# Patient Record
Sex: Male | Born: 1965
Health system: Southern US, Community
[De-identification: ages and names within clinical notes are randomized; demographics above are authoritative.]

## PROBLEM LIST (undated history)

## (undated) DIAGNOSIS — E785 Hyperlipidemia, unspecified: Secondary | ICD-10-CM

## (undated) DIAGNOSIS — B059 Measles without complication: Secondary | ICD-10-CM

## (undated) DIAGNOSIS — E781 Pure hyperglyceridemia: Secondary | ICD-10-CM

## (undated) DIAGNOSIS — B019 Varicella without complication: Secondary | ICD-10-CM

## (undated) DIAGNOSIS — I10 Essential (primary) hypertension: Secondary | ICD-10-CM

## (undated) DIAGNOSIS — G4730 Sleep apnea, unspecified: Secondary | ICD-10-CM

## (undated) DIAGNOSIS — B269 Mumps without complication: Secondary | ICD-10-CM

## (undated) DIAGNOSIS — M10069 Idiopathic gout, unspecified knee: Secondary | ICD-10-CM

## (undated) HISTORY — DX: Essential (primary) hypertension: I10

## (undated) HISTORY — PX: TESTICLE REMOVAL: SHX68

## (undated) HISTORY — DX: Sleep apnea, unspecified: G47.30

## (undated) HISTORY — DX: Idiopathic gout, unspecified knee: M10.069

## (undated) HISTORY — DX: Measles without complication: B05.9

## (undated) HISTORY — DX: Mumps without complication: B26.9

## (undated) HISTORY — DX: Hyperlipidemia, unspecified: E78.5

## (undated) HISTORY — DX: Varicella without complication: B01.9

## (undated) HISTORY — DX: Pure hyperglyceridemia: E78.1

---

## 2009-03-03 ENCOUNTER — Ambulatory Visit: Payer: Self-pay | Admitting: Interventional Radiology

## 2009-03-03 ENCOUNTER — Emergency Department (HOSPITAL_BASED_OUTPATIENT_CLINIC_OR_DEPARTMENT_OTHER): Admission: EM | Admit: 2009-03-03 | Discharge: 2009-03-03 | Payer: Self-pay | Admitting: Emergency Medicine

## 2009-03-11 ENCOUNTER — Emergency Department (HOSPITAL_BASED_OUTPATIENT_CLINIC_OR_DEPARTMENT_OTHER): Admission: EM | Admit: 2009-03-11 | Discharge: 2009-03-11 | Payer: Self-pay | Admitting: Emergency Medicine

## 2013-05-11 ENCOUNTER — Ambulatory Visit (INDEPENDENT_AMBULATORY_CARE_PROVIDER_SITE_OTHER): Payer: BC Managed Care – PPO | Admitting: Physician Assistant

## 2013-05-11 ENCOUNTER — Encounter: Payer: Self-pay | Admitting: Physician Assistant

## 2013-05-11 VITALS — BP 148/102 | HR 74 | Temp 97.6°F | Resp 20 | Ht 74.0 in | Wt 282.5 lb

## 2013-05-11 DIAGNOSIS — L0390 Cellulitis, unspecified: Secondary | ICD-10-CM

## 2013-05-11 DIAGNOSIS — Z299 Encounter for prophylactic measures, unspecified: Secondary | ICD-10-CM

## 2013-05-11 DIAGNOSIS — Z0000 Encounter for general adult medical examination without abnormal findings: Secondary | ICD-10-CM

## 2013-05-11 LAB — BASIC METABOLIC PANEL
Calcium: 9 mg/dL (ref 8.4–10.5)
Glucose, Bld: 78 mg/dL (ref 70–99)
Potassium: 4.3 mEq/L (ref 3.5–5.3)
Sodium: 138 mEq/L (ref 135–145)

## 2013-05-11 LAB — HEPATIC FUNCTION PANEL
AST: 39 U/L — ABNORMAL HIGH (ref 0–37)
Albumin: 4.1 g/dL (ref 3.5–5.2)
Alkaline Phosphatase: 61 U/L (ref 39–117)
Bilirubin, Direct: 0.2 mg/dL (ref 0.0–0.3)
Total Bilirubin: 0.9 mg/dL (ref 0.3–1.2)

## 2013-05-11 LAB — CBC WITH DIFFERENTIAL/PLATELET
Basophils Absolute: 0 10*3/uL (ref 0.0–0.1)
Basophils Relative: 0 % (ref 0–1)
Eosinophils Absolute: 0.1 10*3/uL (ref 0.0–0.7)
Eosinophils Relative: 2 % (ref 0–5)
HCT: 42.4 % (ref 39.0–52.0)
MCHC: 36.1 g/dL — ABNORMAL HIGH (ref 30.0–36.0)
MCV: 83.1 fL (ref 78.0–100.0)
Monocytes Absolute: 0.5 10*3/uL (ref 0.1–1.0)
Platelets: 158 10*3/uL (ref 150–400)
RDW: 13.5 % (ref 11.5–15.5)
WBC: 6.2 10*3/uL (ref 4.0–10.5)

## 2013-05-11 LAB — LIPID PANEL
Cholesterol: 171 mg/dL (ref 0–200)
HDL: 42 mg/dL (ref >39)
Total CHOL/HDL Ratio: 4.1 Ratio
VLDL: 38 mg/dL (ref 0–40)

## 2013-05-11 MED ORDER — DOXYCYCLINE HYCLATE 100 MG PO TABS: 100.0000 mg | ORAL_TABLET | Freq: Two times a day (BID) | ORAL | Status: DC

## 2013-05-11 NOTE — Assessment & Plan Note (Signed)
Rx Doxycycline 100mg  BID x 10 days.  Ibuprofen as needed for pain relief.  Cold compresses to area for swelling. Patient educated on alarm symptoms and when to go to ED.  Patient to return in 1 week for reassessment.

## 2013-05-11 NOTE — Patient Instructions (Addendum)
Please take antibiotic as prescribed.  Try cold compresses for swelling and ibuprofen for pain relief.  Please obtain labs.  DASH Diet The DASH diet stands for "Dietary Approaches to Stop Hypertension." It is a healthy eating plan that has been shown to reduce high blood pressure (hypertension) in as little as 14 days, while also possibly providing other significant health benefits. These other health benefits include reducing the risk of breast cancer after menopause and reducing the risk of type 2 diabetes, heart disease, colon cancer, and stroke. Health benefits also include weight loss and slowing kidney failure in patients with chronic kidney disease.  DIET GUIDELINES  Limit salt (sodium). Your diet should contain less than 1500 mg of sodium daily.  Limit refined or processed carbohydrates. Your diet should include mostly whole grains. Desserts and added sugars should be used sparingly.  Include small amounts of heart-healthy fats. These types of fats include nuts, oils, and tub margarine. Limit saturated and trans fats. These fats have been shown to be harmful in the body. CHOOSING FOODS  The following food groups are based on a 2000 calorie diet. See your Registered Dietitian for individual calorie needs. Grains and Grain Products (6 to 8 servings daily)  Eat More Often: Whole-wheat bread, brown rice, whole-grain or wheat pasta, quinoa, popcorn without added fat or salt (air popped).  Eat Less Often: White bread, white pasta, white rice, cornbread. Vegetables (4 to 5 servings daily)  Eat More Often: Fresh, frozen, and canned vegetables. Vegetables may be raw, steamed, roasted, or grilled with a minimal amount of fat.  Eat Less Often/Avoid: Creamed or fried vegetables. Vegetables in a cheese sauce. Fruit (4 to 5 servings daily)  Eat More Often: All fresh, canned (in natural juice), or frozen fruits. Dried fruits without added sugar. One hundred percent fruit juice ( cup [237 mL]  daily).  Eat Less Often: Dried fruits with added sugar. Canned fruit in light or heavy syrup. [employer], Fish, and Poultry (2 servings or less daily. One serving is 3 to 4 oz [85-114 g]).  Eat More Often: Ninety percent or leaner ground beef, tenderloin, sirloin. Round cuts of beef, chicken breast, [place] breast. All fish. Grill, bake, or broil your meat. Nothing should be fried.  Eat Less Often/Avoid: Fatty cuts of meat, [place], or chicken leg, thigh, or wing. Fried cuts of meat or fish. Dairy (2 to 3 servings)  Eat More Often: Low-fat or fat-free milk, low-fat plain or light yogurt, reduced-fat or part-skim cheese.  Eat Less Often/Avoid: Milk (whole, 2%).Whole milk yogurt. Full-fat cheeses. Nuts, Seeds, and Legumes (4 to 5 servings per week)  Eat More Often: All without added salt.  Eat Less Often/Avoid: Salted nuts and seeds, canned beans with added salt. Fats and Sweets (limited)  Eat More Often: Vegetable oils, tub margarines without trans fats, sugar-free gelatin. Mayonnaise and salad dressings.  Eat Less Often/Avoid: Coconut oils, palm oils, butter, stick margarine, cream, half and half, cookies, candy, pie. FOR MORE INFORMATION The Dash Diet Eating Plan: www.dashdiet.org Document Released: 09/09/2011 Document Revised: 12/13/2011 Document Reviewed: 09/09/2011 [facility] Patient Information 2014 [place], [place].

## 2013-05-11 NOTE — Progress Notes (Signed)
Patient ID: Corey Nguyen, male   DOB: [birth year], 47 y.o.   MRN: [id number]   New patient who will be establishing care with [place] next week.  Presents today for acute visit c/o redness, swelling and pain in left foot x 4 days.  Patient has history of gout and was concerned it had returned.  Area of concern is on the top left foot.  Denies redness to big toe or other joints.  Denies decrease in range of motion.  Denies radiation of pain.  Denies insect bite, trauma or puncture wound.  States it does not feel like gout he had before.  Denies fever, chills, sweats, N/V or rash.  Recently traveled to [place] this past week.  Past Medical History  Diagnosis Date  . Elevated blood pressure   . Idiopathic gout of knee    No current outpatient prescriptions on file prior to visit.   No current facility-administered medications on file prior to visit.   No Known Allergies  No family history on file.  History   Social History  . Marital Status: Married    Spouse Name: N/A    Number of Children: N/A  . Years of Education: N/A   Social History Main Topics  . Smoking status: Never Smoker   . Smokeless tobacco: None  . Alcohol Use: None  . Drug Use: None  . Sexually Active: None   Other Topics Concern  . None   Social History Narrative  . None   Review of Systems  Constitutional: Negative for fever, chills, weight loss and malaise/fatigue.  Gastrointestinal: Negative for nausea, vomiting and abdominal pain.  Musculoskeletal: Negative for myalgias.  Skin: Negative for itching and rash.       + swelling and erythema of L foot   Neurological: Negative for dizziness, tingling, sensory change and headaches.   Filed Vitals:   05/11/13 1137  BP: 148/102  Pulse: 74  Temp: 97.6 F (36.4 C)  Resp: 20   Physical Exam  Constitutional: He is oriented to person, place, and time and well-developed, well-nourished, and in no distress.  HENT:  Head: Normocephalic and atraumatic.  Eyes:  Conjunctivae are normal. Pupils are equal, round, and reactive to light.  Neurological: He is alert and oriented to person, place, and time.  Skin: Skin is warm and dry.  Presence of a 6 cm, circular region of erythema noted on anterior left foot, without drainage or evidence of necrosis.   Assessment/Plan: No problem-specific assessment & plan notes found for this encounter.

## 2013-05-11 NOTE — Assessment & Plan Note (Signed)
Obtain fasting labs today for "New Patient" Visit next week.  Will discuss results with patient at his visit.

## 2013-05-12 LAB — URINALYSIS, ROUTINE W REFLEX MICROSCOPIC
Bilirubin Urine: NEGATIVE
Glucose, UA: NEGATIVE mg/dL
Hgb urine dipstick: NEGATIVE
Ketones, ur: NEGATIVE mg/dL
Leukocytes, UA: NEGATIVE
Protein, ur: NEGATIVE mg/dL
pH: 7 (ref 5.0–8.0)

## 2013-05-12 LAB — TSH: TSH: 0.754 u[IU]/mL (ref 0.350–4.500)

## 2013-05-18 ENCOUNTER — Ambulatory Visit (INDEPENDENT_AMBULATORY_CARE_PROVIDER_SITE_OTHER): Payer: BC Managed Care – PPO | Admitting: Physician Assistant

## 2013-05-18 ENCOUNTER — Encounter: Payer: Self-pay | Admitting: Physician Assistant

## 2013-05-18 VITALS — BP 140/98 | HR 83 | Temp 98.2°F | Resp 18 | Ht 74.0 in | Wt 284.5 lb

## 2013-05-18 DIAGNOSIS — L0291 Cutaneous abscess, unspecified: Secondary | ICD-10-CM

## 2013-05-18 DIAGNOSIS — I10 Essential (primary) hypertension: Secondary | ICD-10-CM

## 2013-05-18 DIAGNOSIS — Z299 Encounter for prophylactic measures, unspecified: Secondary | ICD-10-CM

## 2013-05-18 DIAGNOSIS — J309 Allergic rhinitis, unspecified: Secondary | ICD-10-CM

## 2013-05-18 DIAGNOSIS — L739 Follicular disorder, unspecified: Secondary | ICD-10-CM | POA: Insufficient documentation

## 2013-05-18 DIAGNOSIS — L738 Other specified follicular disorders: Secondary | ICD-10-CM

## 2013-05-18 DIAGNOSIS — L0390 Cellulitis, unspecified: Secondary | ICD-10-CM

## 2013-05-18 MED ORDER — FLUTICASONE PROPIONATE 50 MCG/ACT NA SUSP: 2.0000 | Freq: Every day | NASAL | Status: DC

## 2013-05-18 MED ORDER — ERYTHROMYCIN 2 % EX GEL: Freq: Every day | CUTANEOUS | Status: DC

## 2013-05-18 NOTE — Assessment & Plan Note (Signed)
Attempt trial of lifestyle modifications.  DASH diet.  Caloric Restriction.  Aerobic exercise.  Patient given information.  Follow-up in 3 months.

## 2013-05-18 NOTE — Assessment & Plan Note (Signed)
Resolving.  Finish course of doxycycline.

## 2013-05-18 NOTE — Assessment & Plan Note (Signed)
Reviewed labs with patient.  Patient instructed on importance of annual eye examinations and dental visits.

## 2013-05-18 NOTE — Assessment & Plan Note (Signed)
Rx topical Erythromycin ointment.

## 2013-05-18 NOTE — Patient Instructions (Addendum)
Please finish prescription for doxycycline.  Attempt diet and weight loss (see information below) for blood pressure, snoring and fatigue.  Please schedule appointment for 3 months to re-assess blood pressure and recheck liver enzymes.  Try a daily claritin and Flonase for congestion/allergy relief.  DASH Diet The DASH diet stands for "Dietary Approaches to Stop Hypertension." It is a healthy eating plan that has been shown to reduce high blood pressure (hypertension) in as little as 14 days, while also possibly providing other significant health benefits. These other health benefits include reducing the risk of breast cancer after menopause and reducing the risk of type 2 diabetes, heart disease, colon cancer, and stroke. Health benefits also include weight loss and slowing kidney failure in patients with chronic kidney disease.  DIET GUIDELINES  Limit salt (sodium). Your diet should contain less than 1500 mg of sodium daily.  Limit refined or processed carbohydrates. Your diet should include mostly whole grains. Desserts and added sugars should be used sparingly.  Include small amounts of heart-healthy fats. These types of fats include nuts, oils, and tub margarine. Limit saturated and trans fats. These fats have been shown to be harmful in the body. CHOOSING FOODS  The following food groups are based on a 2000 calorie diet. See your Registered Dietitian for individual calorie needs. Grains and Grain Products (6 to 8 servings daily)  Eat More Often: Whole-wheat bread, brown rice, whole-grain or wheat pasta, quinoa, popcorn without added fat or salt (air popped).  Eat Less Often: White bread, white pasta, white rice, cornbread. Vegetables (4 to 5 servings daily)  Eat More Often: Fresh, frozen, and canned vegetables. Vegetables may be raw, steamed, roasted, or grilled with a minimal amount of fat.  Eat Less Often/Avoid: Creamed or fried vegetables. Vegetables in a cheese sauce. Fruit (4 to 5  servings daily)  Eat More Often: All fresh, canned (in natural juice), or frozen fruits. Dried fruits without added sugar. One hundred percent fruit juice ( cup [237 mL] daily).  Eat Less Often: Dried fruits with added sugar. Canned fruit in light or heavy syrup. [employer], Fish, and Poultry (2 servings or less daily. One serving is 3 to 4 oz [85-114 g]).  Eat More Often: Ninety percent or leaner ground beef, tenderloin, sirloin. Round cuts of beef, chicken breast, [place] breast. All fish. Grill, bake, or broil your meat. Nothing should be fried.  Eat Less Often/Avoid: Fatty cuts of meat, [place], or chicken leg, thigh, or wing. Fried cuts of meat or fish. Dairy (2 to 3 servings)  Eat More Often: Low-fat or fat-free milk, low-fat plain or light yogurt, reduced-fat or part-skim cheese.  Eat Less Often/Avoid: Milk (whole, 2%).Whole milk yogurt. Full-fat cheeses. Nuts, Seeds, and Legumes (4 to 5 servings per week)  Eat More Often: All without added salt.  Eat Less Often/Avoid: Salted nuts and seeds, canned beans with added salt. Fats and Sweets (limited)  Eat More Often: Vegetable oils, tub margarines without trans fats, sugar-free gelatin. Mayonnaise and salad dressings.  Eat Less Often/Avoid: Coconut oils, palm oils, butter, stick margarine, cream, half and half, cookies, candy, pie. FOR MORE INFORMATION The Dash Diet Eating Plan: www.dashdiet.org Document Released: 09/09/2011 Document Revised: 12/13/2011 Document Reviewed: 09/09/2011 [facility] Patient Information 2014 [place], [place].  Calorie Counting Diet A calorie counting diet requires you to eat the number of calories that are right for you in a day. Calories are the measurement of how much energy you get from the food you eat. Eating the right  amount of calories is important for staying at a healthy weight. If you eat too many calories, your body will store them as fat and you may gain weight. If you eat too few calories, you  may lose weight. Counting the number of calories you eat during a day will help you know if you are eating the right amount. A Registered Dietitian can determine how many calories you need in a day. The amount of calories needed varies from person to person. If your goal is to lose weight, you will need to eat fewer calories. Losing weight can benefit you if you are overweight or have health problems such as heart disease, high blood pressure, or diabetes. If your goal is to gain weight, you will need to eat more calories. Gaining weight may be necessary if you have a certain health problem that causes your body to need more energy. TIPS Whether you are increasing or decreasing the number of calories you eat during a day, it may be hard to get used to changes in what you eat and drink. The following are tips to help you keep track of the number of calories you eat.  Measure foods at home with measuring cups. This helps you know the amount of food and number of calories you are eating.  Restaurants often serve food in amounts that are larger than 1 serving. While eating out, estimate how many servings of a food you are given. For example, a serving of cooked rice is  cup or about the size of half of a fist. Knowing serving sizes will help you be aware of how much food you are eating at restaurants.  Ask for smaller portion sizes or child-size portions at restaurants.  Plan to eat half of a meal at a restaurant. Take the rest home or share the other half with a friend.  Read the Nutrition Facts panel on food labels for calorie content and serving size. You can find out how many servings are in a package, the size of a serving, and the number of calories each serving has.  For example, a package might contain 3 cookies. The Nutrition Facts panel on that package says that 1 serving is 1 cookie. Below that, it will say there are 3 servings in the container. The calories section of the Nutrition Facts  label says there are 90 calories. This means there are 90 calories in 1 cookie (1 serving). If you eat 1 cookie you have eaten 90 calories. If you eat all 3 cookies, you have eaten 270 calories (3 servings x 90 calories = 270 calories). The list below tells you how big or small some common portion sizes are.  1 oz.........4 stacked dice.  3 oz........Marland KitchenDeck of cards.  1 tsp.......Mar[place] KitchenTip of little finger.  1 tbs......Marland KitchenMarland KitchenThumb.  2 tbs.......Marland KitchenGolf ball.   cup......Marland KitchenHalf of a fist.  1 cup.......Marland KitchenA fist. KEEP A FOOD LOG Write down every food item you eat, the amount you eat, and the number of calories in each food you eat during the day. At the end of the day, you can add up the total number of calories you have eaten. It may help to keep a list like the one below. Find out the calorie information by reading the Nutrition Facts panel on food labels. Breakfast  Bran cereal (1 cup, 110 calories).  Fat-free milk ( cup, 45 calories). Snack  Apple (1 medium, 80 calories). Lunch  Spinach (1 cup, 20 calories).  Tomato ( medium, 20  calories).  Chicken breast strips (3 oz, 165 calories).  Shredded cheddar cheese ( cup, 110 calories).  Light [place] dressing (2 tbs, 60 calories).  Whole-wheat bread (1 slice, 80 calories).  Tub margarine (1 tsp, 35 calories).  Vegetable soup (1 cup, 160 calories). Dinner  Pork chop (3 oz, 190 calories).  Brown rice (1 cup, 215 calories).  Steamed broccoli ( cup, 20 calories).  Strawberries (1  cup, 65 calories).  Whipped cream (1 tbs, 50 calories). Daily Calorie Total: 1425 Document Released: 09/20/2005 Document Revised: 12/13/2011 Document Reviewed: 03/17/2007 [facility] Patient Information 2014 [place], [place].

## 2013-05-18 NOTE — Progress Notes (Signed)
Patient ID: Corey Nguyen, male   DOB: July 21, 1966, 47 y.o.   MRN: [id number]   Patient is a 47 year-old caucasian male wh presents to establish care.   Health Maintenance: (1) Annual Eye Exam -- last visit 6 years ago. (2) Dental Care -- last visit 6 years ago. (3) Immunizations -- Patient reports being up to date.  Acute Concerns: Elevated BP -- patient states he has a history of elevated blood pressure but no formal diagnosis.  BP has been elevated at today's visit and visit last week.  Denies headache, lightheadedness, dizziness.  Denies history of high cholesterol.  Fatigue -- patient states he has noticed some increase in fatigue over the past few months.  Denies difficulty sleeping but states he snores, wakes up feeling like he stopped breathing, and never feels completely rested.  His wife has mentioned his snoring and "apneic" episodes.  Denies former diagnosis of sleep apnea.  Has never had a sleep study. Endorses weight gain of 20 or so pounds over the past year  Cellulitis of L foot -- Resolving.  Patient states no pain or discomfort.  2 more days of doxycycline antibiotic.  Patient endorses chronic folliculitis of lower torso.  Does not shave.  States occasionally he wil develop "boils" to the area if the folliculitis gets too agitated by his clothing.  Past Medical History  Diagnosis Date  . Elevated blood pressure   . Idiopathic gout of knee   . Chicken pox     childhood  . Measles     childhood  . Mumps     childhood   Current Outpatient Prescriptions on File Prior to Visit  Medication Sig Dispense Refill  . doxycycline (VIBRA-TABS) 100 MG tablet Take 1 tablet (100 mg total) by mouth 2 (two) times daily.  20 tablet  0   No current facility-administered medications on file prior to visit.   No Known Allergies Family History  Problem Relation Age of Onset  . Hypercholesterolemia Father     Living  . Hypercholesterolemia Brother   . Healthy Mother     Living   . Diabetes Maternal Grandfather     Insulin  . Heart defect Maternal Grandmother   . Prostate cancer Paternal Grandfather   . Healthy Paternal Grandmother     Living  . Healthy Paternal Uncle   . Diabetes Maternal Aunt   . Healthy Maternal Aunt   . Alzheimer's disease Maternal Grandmother   . Migraines Daughter   . Healthy Other     Siblings   History   Social History  . Marital Status: Married    Spouse Name: N/A    Number of Children: N/A  . Years of Education: N/A   Social History Main Topics  . Smoking status: Never Smoker   . Smokeless tobacco: Former Neurosurgeon  . Alcohol Use: 14.4 oz/week    24 Cans of beer per week  . Drug Use: No  . Sexual Activity: None   Other Topics Concern  . None   Social History Narrative  . None   Review of Systems  Constitutional: Positive for malaise/fatigue. Negative for fever, chills and weight loss.  HENT: Negative for hearing loss, ear pain, neck pain, tinnitus and ear discharge.   Eyes: Negative for blurred vision, double vision and photophobia.  Respiratory: Negative for cough, hemoptysis, sputum production, shortness of breath and wheezing.   Cardiovascular: Negative for chest pain, palpitations and leg swelling.  Gastrointestinal: Negative for nausea, vomiting, abdominal pain,  diarrhea, constipation, blood in stool and melena.  Genitourinary: Negative for dysuria, urgency, frequency, hematuria and flank pain.  Musculoskeletal: Negative for myalgias, back pain and joint pain.  Skin: Positive for rash.  Neurological: Negative for dizziness, tingling, seizures, loss of consciousness and headaches.  Endo/Heme/Allergies: Positive for environmental allergies. Does not bruise/bleed easily.  Psychiatric/Behavioral: Negative for depression, suicidal ideas and substance abuse. The patient does not have insomnia.    Filed Vitals:   05/18/13 1344  BP: 140/98  Pulse: 83  Temp: 98.2 F (36.8 C)  Resp: 18   Physical Exam  Vitals  reviewed. Constitutional: He is oriented to person, place, and time and well-developed, well-nourished, and in no distress.  HENT:  Head: Normocephalic and atraumatic.  Right Ear: External ear normal.  Left Ear: External ear normal.  Nose: Nose normal.  Mouth/Throat: Oropharynx is clear and moist. No oropharyngeal exudate.  TM WNL bilaterally  Eyes: Conjunctivae and EOM are normal. Pupils are equal, round, and reactive to light.  Neck: Normal range of motion. Neck supple. No thyromegaly present.  Cardiovascular: Normal rate, regular rhythm, normal heart sounds and intact distal pulses.   Pulmonary/Chest: Effort normal and breath sounds normal. No respiratory distress. He has no wheezes. He has no rales. He exhibits no tenderness.  Abdominal: Soft. Bowel sounds are normal. He exhibits no distension and no mass. There is no tenderness. There is no rebound and no guarding.  Genitourinary:  Deferred GU examination  Musculoskeletal: Normal range of motion.  Lymphadenopathy:    He has no cervical adenopathy.  Neurological: He is alert and oriented to person, place, and time. He has normal reflexes. No cranial nerve deficit.  Skin: Skin is warm and dry. No rash noted.    Assessment/Plan: Cellulitis Resolving.  Finish course of doxycycline.  Essential hypertension, benign Attempt trial of lifestyle modifications.  DASH diet.  Caloric Restriction.  Aerobic exercise.  Patient given information.  Follow-up in 3 months.  Preventive measure Reviewed labs with patient.  Patient instructed on importance of annual eye examinations and dental visits.  Folliculitis Rx topical Erythromycin ointment.

## 2013-08-09 ENCOUNTER — Other Ambulatory Visit: Payer: Self-pay

## 2013-08-24 ENCOUNTER — Ambulatory Visit (INDEPENDENT_AMBULATORY_CARE_PROVIDER_SITE_OTHER): Payer: BC Managed Care – PPO | Admitting: Physician Assistant

## 2013-08-24 ENCOUNTER — Encounter: Payer: Self-pay | Admitting: Physician Assistant

## 2013-08-24 VITALS — BP 148/104 | HR 76 | Temp 98.0°F | Resp 16 | Ht 74.0 in | Wt 282.2 lb

## 2013-08-24 DIAGNOSIS — R899 Unspecified abnormal finding in specimens from other organs, systems and tissues: Secondary | ICD-10-CM

## 2013-08-24 DIAGNOSIS — I10 Essential (primary) hypertension: Secondary | ICD-10-CM

## 2013-08-24 DIAGNOSIS — R6889 Other general symptoms and signs: Secondary | ICD-10-CM

## 2013-08-24 LAB — LIPID PANEL
Cholesterol: 192 mg/dL (ref 0–200)
Total CHOL/HDL Ratio: 4.2 Ratio
Triglycerides: 195 mg/dL — ABNORMAL HIGH (ref <150)

## 2013-08-24 LAB — COMPREHENSIVE METABOLIC PANEL
ALT: 58 U/L — ABNORMAL HIGH (ref 0–53)
BUN: 14 mg/dL (ref 6–23)
CO2: 24 mEq/L (ref 19–32)
Calcium: 9 mg/dL (ref 8.4–10.5)
Chloride: 103 mEq/L (ref 96–112)
Creat: 0.88 mg/dL (ref 0.50–1.35)
Glucose, Bld: 86 mg/dL (ref 70–99)

## 2013-08-24 MED ORDER — LISINOPRIL-HYDROCHLOROTHIAZIDE 10-12.5 MG PO TABS: 1.0000 | ORAL_TABLET | Freq: Every day | ORAL | Status: DC

## 2013-08-24 NOTE — Progress Notes (Signed)
Pre visit review using our clinic review tool, if applicable. No additional management support is needed unless otherwise documented below in the visit note/SLS  

## 2013-08-24 NOTE — Patient Instructions (Signed)
Please take medication as prescribed.  Monitor BP at home -- goal is 120-130/70-80.  I want to see you in 2 weeks.  Please return sooner if you develop a BP lower than 100/60 or if you develop a cough, palpitations, lightheadedness.    DASH Diet The DASH diet stands for "Dietary Approaches to Stop Hypertension." It is a healthy eating plan that has been shown to reduce high blood pressure (hypertension) in as little as 14 days, while also possibly providing other significant health benefits. These other health benefits include reducing the risk of breast cancer after menopause and reducing the risk of type 2 diabetes, heart disease, colon cancer, and stroke. Health benefits also include weight loss and slowing kidney failure in patients with chronic kidney disease.  DIET GUIDELINES  Limit salt (sodium). Your diet should contain less than 1500 mg of sodium daily.  Limit refined or processed carbohydrates. Your diet should include mostly whole grains. Desserts and added sugars should be used sparingly.  Include small amounts of heart-healthy fats. These types of fats include nuts, oils, and tub margarine. Limit saturated and trans fats. These fats have been shown to be harmful in the body. CHOOSING FOODS  The following food groups are based on a 2000 calorie diet. See your Registered Dietitian for individual calorie needs. Grains and Grain Products (6 to 8 servings daily)  Eat More Often: Whole-wheat bread, brown rice, whole-grain or wheat pasta, quinoa, popcorn without added fat or salt (air popped).  Eat Less Often: White bread, white pasta, white rice, cornbread. Vegetables (4 to 5 servings daily)  Eat More Often: Fresh, frozen, and canned vegetables. Vegetables may be raw, steamed, roasted, or grilled with a minimal amount of fat.  Eat Less Often/Avoid: Creamed or fried vegetables. Vegetables in a cheese sauce. Fruit (4 to 5 servings daily)  Eat More Often: All fresh, canned (in natural  juice), or frozen fruits. Dried fruits without added sugar. One hundred percent fruit juice ( cup [237 mL] daily).  Eat Less Often: Dried fruits with added sugar. Canned fruit in light or heavy syrup. [employer], Fish, and Poultry (2 servings or less daily. One serving is 3 to 4 oz [85-114 g]).  Eat More Often: Ninety percent or leaner ground beef, tenderloin, sirloin. Round cuts of beef, chicken breast, [place] breast. All fish. Grill, bake, or broil your meat. Nothing should be fried.  Eat Less Often/Avoid: Fatty cuts of meat, [place], or chicken leg, thigh, or wing. Fried cuts of meat or fish. Dairy (2 to 3 servings)  Eat More Often: Low-fat or fat-free milk, low-fat plain or light yogurt, reduced-fat or part-skim cheese.  Eat Less Often/Avoid: Milk (whole, 2%).Whole milk yogurt. Full-fat cheeses. Nuts, Seeds, and Legumes (4 to 5 servings per week)  Eat More Often: All without added salt.  Eat Less Often/Avoid: Salted nuts and seeds, canned beans with added salt. Fats and Sweets (limited)  Eat More Often: Vegetable oils, tub margarines without trans fats, sugar-free gelatin. Mayonnaise and salad dressings.  Eat Less Often/Avoid: Coconut oils, palm oils, butter, stick margarine, cream, half and half, cookies, candy, pie. FOR MORE INFORMATION The Dash Diet Eating Plan: www.dashdiet.org Document Released: 09/09/2011 Document Revised: 12/13/2011 Document Reviewed: 09/09/2011 [facility] Patient Information 2014 [place], [place].

## 2013-08-26 NOTE — Progress Notes (Signed)
Patient ID: Corey Nguyen, male   DOB: Dec 08, 1965, 47 y.o.   MRN: [id number]  Patient presents to clinic today for follow-up of hypertension.  Patient has attempted 79-month trial of diet and exercise.  Patient states he has made some lifestyle changes but does not feel he has made any progress.  BP at 148/104 in clinic at time of visit.  Patient still denies chest pain, palpitations, light headedness, N/V, shortness of breath.  Denies significant stressors that could be responsible for elevated bp.  Denies history of anxiety or depression.  Patient states he is doing well.   Past Medical History  Diagnosis Date  . Elevated blood pressure   . Idiopathic gout of knee   . Chicken pox     childhood  . Measles     childhood  . Mumps     childhood    Current Outpatient Prescriptions on File Prior to Visit  Medication Sig Dispense Refill  . fluticasone (FLONASE) 50 MCG/ACT nasal spray Place 2 sprays into the nose daily.  16 g  6  . oxymetazoline (AFRIN) 0.05 % nasal spray Place 2 sprays into the nose at bedtime as needed for congestion.       No current facility-administered medications on file prior to visit.    No Known Allergies  Family History  Problem Relation Age of Onset  . Hypercholesterolemia Father     Living  . Hypercholesterolemia Brother   . Healthy Mother     Living  . Diabetes Maternal Grandfather     Insulin  . Heart defect Maternal Grandmother   . Prostate cancer Paternal Grandfather   . Healthy Paternal Grandmother     Living  . Healthy Paternal Uncle   . Diabetes Maternal Aunt   . Healthy Maternal Aunt   . Alzheimer's disease Maternal Grandmother   . Migraines Daughter   . Healthy Other     Siblings    History   Social History  . Marital Status: Married    Spouse Name: N/A    Number of Children: N/A  . Years of Education: N/A   Social History Main Topics  . Smoking status: Never Smoker   . Smokeless tobacco: Former Neurosurgeon  . Alcohol Use: 14.4  oz/week    24 Cans of beer per week  . Drug Use: No  . Sexual Activity: None   Other Topics Concern  . None   Social History Narrative  . None    Review of Systems - See HPI.  All other ROS are negative.   Filed Vitals:   08/24/13 0826  BP: 148/104  Pulse: 76  Temp: 98 F (36.7 C)  Resp: 16   Physical Exam  Vitals reviewed. Constitutional: He is oriented to person, place, and time.  Obese, well-developed in no acute distress, sitting on examination table.  HENT:  Head: Normocephalic and atraumatic.  Eyes: Conjunctivae and EOM are normal.  Neck: Neck supple.  Cardiovascular: Normal rate, regular rhythm, normal heart sounds and intact distal pulses.   Pulmonary/Chest: Effort normal and breath sounds normal. No respiratory distress. He has no wheezes. He has no rales. He exhibits no tenderness.  Lymphadenopathy:    He has no cervical adenopathy.  Neurological: He is alert and oriented to person, place, and time. No cranial nerve deficit.  Skin: Skin is warm and dry. No rash noted.  Psychiatric: Affect normal.   Recent Results (from the past 2160 hour(s))  COMPREHENSIVE METABOLIC PANEL  Status: Abnormal   Collection Time    08/24/13  9:27 AM      Result Value Range   Sodium 138  135 - 145 mEq/L   Potassium 4.6  3.5 - 5.3 mEq/L   Chloride 103  96 - 112 mEq/L   CO2 24  19 - 32 mEq/L   Glucose, Bld 86  70 - 99 mg/dL   BUN 14  6 - 23 mg/dL   Creat 4.40  1.02 - 7.25 mg/dL   Total Bilirubin 0.7  0.3 - 1.2 mg/dL   Alkaline Phosphatase 66  39 - 117 U/L   AST 41 (*) 0 - 37 U/L   ALT 58 (*) 0 - 53 U/L   Total Protein 7.4  6.0 - 8.3 g/dL   Albumin 4.6  3.5 - 5.2 g/dL   Calcium 9.0  8.4 - 36.6 mg/dL  LIPID PANEL     Status: Abnormal   Collection Time    08/24/13  9:27 AM      Result Value Range   Cholesterol 192  0 - 200 mg/dL   Comment: ATP III Classification:           < 200        mg/dL        Desirable          200 - 239     mg/dL        Borderline High           >= 240        mg/dL        High         Triglycerides 195 (*) <150 mg/dL   HDL 46  >44 mg/dL   Total CHOL/HDL Ratio 4.2     VLDL 39  0 - 40 mg/dL   LDL Cholesterol 034 (*) 0 - 99 mg/dL   Comment:       Total Cholesterol/HDL Ratio:CHD Risk                            Coronary Heart Disease Risk Table                                            Men       Women              1/2 Average Risk              3.4        3.3                  Average Risk              5.0        4.4               2X Average Risk              9.6        7.1               3X Average Risk             23.4       11.0     Use the calculated Patient Ratio above and the CHD Risk table      to determine the patient's CHD Risk.  ATP III Classification (LDL):           < 100        mg/dL         Optimal          100 - 129     mg/dL         Near or Above Optimal          130 - 159     mg/dL         Borderline High          160 - 189     mg/dL         High           > 190        mg/dL         Very High          Assessment/Plan: Essential hypertension, benign Rx Lisinopril-HCTZ 10-12.5 mg daily.  Continue with diet and exercise.  DASH handout given again.  Monitor BP at home.  Follow-up in 2 weeks.

## 2013-08-26 NOTE — Assessment & Plan Note (Signed)
Rx Lisinopril-HCTZ 10-12.5 mg daily.  Continue with diet and exercise.  DASH handout given again.  Monitor BP at home.  Follow-up in 2 weeks.

## 2013-09-07 ENCOUNTER — Encounter: Payer: Self-pay | Admitting: Physician Assistant

## 2013-09-07 ENCOUNTER — Ambulatory Visit (INDEPENDENT_AMBULATORY_CARE_PROVIDER_SITE_OTHER): Payer: BC Managed Care – PPO | Admitting: Physician Assistant

## 2013-09-07 VITALS — BP 138/94 | HR 86 | Temp 97.5°F | Resp 16 | Ht 74.0 in | Wt 286.0 lb

## 2013-09-07 DIAGNOSIS — I10 Essential (primary) hypertension: Secondary | ICD-10-CM

## 2013-09-07 NOTE — Progress Notes (Signed)
Patient ID: Corey Nguyen, male   DOB: 1965-11-16, 47 y.o.   MRN: [id number]  Patient presents to clinic today for followup of hypertension.  At last visit, patient was placed on lisinopril-hydrochlorothiazide 10-12.5 mg tablet.  Patient endorses taking medication as prescribed. BP clinic today 138/94, improved from last visit.  Patient still denies chest pain, headache, vision changes, palpitations, shortness of breath.  Patient denies cough while on ACE inhibitor.  Patient has been trying to monitor salt intake. Has not been exercising. Has a treadmill, and is trying to start exercising daily.   Past Medical History  Diagnosis Date  . Elevated blood pressure   . Idiopathic gout of knee   . Chicken pox     childhood  . Measles     childhood  . Mumps     childhood    Current Outpatient Prescriptions on File Prior to Visit  Medication Sig Dispense Refill  . fluticasone (FLONASE) 50 MCG/ACT nasal spray Place 2 sprays into the nose daily.  16 g  6  . lisinopril-hydrochlorothiazide (PRINZIDE,ZESTORETIC) 10-12.5 MG per tablet Take 1 tablet by mouth daily.  30 tablet  3  . oxymetazoline (AFRIN) 0.05 % nasal spray Place 2 sprays into the nose at bedtime as needed for congestion.       No current facility-administered medications on file prior to visit.    No Known Allergies  Family History  Problem Relation Age of Onset  . Hypercholesterolemia Father     Living  . Hypercholesterolemia Brother   . Healthy Mother     Living  . Diabetes Maternal Grandfather     Insulin  . Heart defect Maternal Grandmother   . Prostate cancer Paternal Grandfather   . Healthy Paternal Grandmother     Living  . Healthy Paternal Uncle   . Diabetes Maternal Aunt   . Healthy Maternal Aunt   . Alzheimer's disease Maternal Grandmother   . Migraines Daughter   . Healthy Other     Siblings    History   Social History  . Marital Status: Married    Spouse Name: N/A    Number of Children: N/A  .  Years of Education: N/A   Social History Main Topics  . Smoking status: Never Smoker   . Smokeless tobacco: Former Neurosurgeon  . Alcohol Use: 14.4 oz/week    24 Cans of beer per week  . Drug Use: No  . Sexual Activity: None   Other Topics Concern  . None   Social History Narrative  . None    Review of Systems - see history of present illness. All other review of systems are negative.   Filed Vitals:   09/07/13 1609  BP: 138/94  Pulse: 86  Temp: 97.5 F (36.4 C)  Resp: 16   Physical Exam  Vitals reviewed. Constitutional: He is oriented to person, place, and time and well-developed, well-nourished, and in no distress.  HENT:  Head: Normocephalic and atraumatic.  Eyes: Conjunctivae are normal.  Cardiovascular: Normal rate, regular rhythm, normal heart sounds and intact distal pulses.   Pulmonary/Chest: Effort normal and breath sounds normal. No respiratory distress. He has no wheezes. He has no rales. He exhibits no tenderness.  Neurological: He is alert and oriented to person, place, and time. No cranial nerve deficit.  Skin: Skin is warm and dry. No rash noted.  Psychiatric: Affect normal.   Recent Results (from the past 2160 hour(s))  COMPREHENSIVE METABOLIC PANEL     Status: Abnormal  Collection Time    08/24/13  9:27 AM      Result Value Range   Sodium 138  135 - 145 mEq/L   Potassium 4.6  3.5 - 5.3 mEq/L   Chloride 103  96 - 112 mEq/L   CO2 24  19 - 32 mEq/L   Glucose, Bld 86  70 - 99 mg/dL   BUN 14  6 - 23 mg/dL   Creat 1.61  0.96 - 0.45 mg/dL   Total Bilirubin 0.7  0.3 - 1.2 mg/dL   Alkaline Phosphatase 66  39 - 117 U/L   AST 41 (*) 0 - 37 U/L   ALT 58 (*) 0 - 53 U/L   Total Protein 7.4  6.0 - 8.3 g/dL   Albumin 4.6  3.5 - 5.2 g/dL   Calcium 9.0  8.4 - 40.9 mg/dL  LIPID PANEL     Status: Abnormal   Collection Time    08/24/13  9:27 AM      Result Value Range   Cholesterol 192  0 - 200 mg/dL   Comment: ATP III Classification:           < 200         mg/dL        Desirable          200 - 239     mg/dL        Borderline High          >= 240        mg/dL        High         Triglycerides 195 (*) <150 mg/dL   HDL 46  >81 mg/dL   Total CHOL/HDL Ratio 4.2     VLDL 39  0 - 40 mg/dL   LDL Cholesterol 191 (*) 0 - 99 mg/dL   Comment:       Total Cholesterol/HDL Ratio:CHD Risk                            Coronary Heart Disease Risk Table                                            Men       Women              1/2 Average Risk              3.4        3.3                  Average Risk              5.0        4.4               2X Average Risk              9.6        7.1               3X Average Risk             23.4       11.0     Use the calculated Patient Ratio above and the CHD Risk table      to determine the patient's CHD Risk.  ATP III Classification (LDL):           < 100        mg/dL         Optimal          100 - 129     mg/dL         Near or Above Optimal          130 - 159     mg/dL         Borderline High          160 - 189     mg/dL         High           > 190        mg/dL         Very High         Assessment/Plan: Essential hypertension, benign Discussed goal blood pressure with patient.  Patient wishes to attempt to remain on current dose of medication, while using his treadmill at least 3 times a week.  Will continue with sodium restriction in DASH diet.  Followup in one month. Instructed patient up blood pressure remains elevated despite TLC, we'll need to adjust dose of medication.

## 2013-09-07 NOTE — Patient Instructions (Signed)
Continue medication as prescribed.  Increase activity -- use your treadmill !! Follow-up in 1 month for BP recheck.  If BP still elevated, we will need to increase dose of medicine.  Hypertension As your heart beats, it forces blood through your arteries. This force is your blood pressure. If the pressure is too high, it is called hypertension (HTN) or high blood pressure. HTN is dangerous because you may have it and not know it. High blood pressure may mean that your heart has to work harder to pump blood. Your arteries may be narrow or stiff. The extra work puts you at risk for heart disease, stroke, and other problems.  Blood pressure consists of two numbers, a higher number over a lower, 110/72, for example. It is stated as "110 over 72." The ideal is below 120 for the top number (systolic) and under 80 for the bottom (diastolic). Write down your blood pressure today. You should pay close attention to your blood pressure if you have certain conditions such as:  Heart failure.  Prior heart attack.  Diabetes  Chronic kidney disease.  Prior stroke.  Multiple risk factors for heart disease. To see if you have HTN, your blood pressure should be measured while you are seated with your arm held at the level of the heart. It should be measured at least twice. A one-time elevated blood pressure reading (especially in the Emergency Department) does not mean that you need treatment. There may be conditions in which the blood pressure is different between your right and left arms. It is important to see your caregiver soon for a recheck. Most people have essential hypertension which means that there is not a specific cause. This type of high blood pressure may be lowered by changing lifestyle factors such as:  Stress.  Smoking.  Lack of exercise.  Excessive weight.  Drug/tobacco/alcohol use.  Eating less salt. Most people do not have symptoms from high blood pressure until it has caused  damage to the body. Effective treatment can often prevent, delay or reduce that damage. TREATMENT  When a cause has been identified, treatment for high blood pressure is directed at the cause. There are a large number of medications to treat HTN. These fall into several categories, and your caregiver will help you select the medicines that are best for you. Medications may have side effects. You should review side effects with your caregiver. If your blood pressure stays high after you have made lifestyle changes or started on medicines,   Your medication(s) may need to be changed.  Other problems may need to be addressed.  Be certain you understand your prescriptions, and know how and when to take your medicine.  Be sure to follow up with your caregiver within the time frame advised (usually within two weeks) to have your blood pressure rechecked and to review your medications.  If you are taking more than one medicine to lower your blood pressure, make sure you know how and at what times they should be taken. Taking two medicines at the same time can result in blood pressure that is too low. SEEK IMMEDIATE MEDICAL CARE IF:  You develop a severe headache, blurred or changing vision, or confusion.  You have unusual weakness or numbness, or a faint feeling.  You have severe chest or abdominal pain, vomiting, or breathing problems. MAKE SURE YOU:   Understand these instructions.  Will watch your condition.  Will get help right away if you are not doing well or  get worse. Document Released: 09/20/2005 Document Revised: 12/13/2011 Document Reviewed: 05/10/2008 [facility] Patient Information 2014 [place].

## 2013-09-07 NOTE — Assessment & Plan Note (Signed)
Discussed goal blood pressure with patient.  Patient wishes to attempt to remain on current dose of medication, while using his treadmill at least 3 times a week.  Will continue with sodium restriction in DASH diet.  Followup in one month. Instructed patient up blood pressure remains elevated despite TLC, we'll need to adjust dose of medication.

## 2013-09-07 NOTE — Progress Notes (Signed)
Pre visit review using our clinic review tool, if applicable. No additional management support is needed unless otherwise documented below in the visit note/SLS  

## 2013-10-10 ENCOUNTER — Ambulatory Visit (INDEPENDENT_AMBULATORY_CARE_PROVIDER_SITE_OTHER): Payer: BC Managed Care – PPO | Admitting: Physician Assistant

## 2013-10-10 ENCOUNTER — Encounter: Payer: Self-pay | Admitting: Physician Assistant

## 2013-10-10 VITALS — BP 122/84 | HR 73 | Temp 97.9°F | Resp 16 | Ht 74.0 in | Wt 281.5 lb

## 2013-10-10 DIAGNOSIS — I10 Essential (primary) hypertension: Secondary | ICD-10-CM

## 2013-10-10 NOTE — Assessment & Plan Note (Signed)
BP good in clinic.  Continue current medication regimen.  Encouraged continued diet and exercise.  Follow-up in 6 months.

## 2013-10-10 NOTE — Patient Instructions (Signed)
Please continue taking medications as prescribed  Continue good diet and exercising on the treadmill.  I want to see you again in 6 months.  Return sooner if you need [place].  Managing Your High Blood Pressure Blood pressure is a measurement of how forceful your blood is pressing against the walls of the arteries. Arteries are muscular tubes within the circulatory system. Blood pressure does not stay the same. Blood pressure rises when you are active, excited, or nervous; and it lowers during sleep and relaxation. If the numbers measuring your blood pressure stay above normal most of the time, you are at risk for health problems. High blood pressure (hypertension) is a long-term (chronic) condition in which blood pressure is elevated. A blood pressure reading is recorded as two numbers, such as 120 over 80 (or 120/80). The first, higher number is called the systolic pressure. It is a measure of the pressure in your arteries as the heart beats. The second, lower number is called the diastolic pressure. It is a measure of the pressure in your arteries as the heart relaxes between beats.  Keeping your blood pressure in a normal range is important to your overall health and prevention of health problems, such as heart disease and stroke. When your blood pressure is uncontrolled, your heart has to work harder than normal. High blood pressure is a very common condition in adults because blood pressure tends to rise with age. Men and women are equally likely to have hypertension but at different times in life. Before age 71, men are more likely to have hypertension. After 50 years of age, women are more likely to have it. Hypertension is especially common in African Americans. This condition often has no signs or symptoms. The cause of the condition is usually not known. Your caregiver can help you come up with a plan to keep your blood pressure in a normal, healthy range. BLOOD PRESSURE STAGES Blood pressure is  classified into four stages: normal, prehypertension, stage 1, and stage 2. Your blood pressure reading will be used to determine what type of treatment, if any, is necessary. Appropriate treatment options are tied to these four stages:  Normal  Systolic pressure (mm Hg): below 120.  Diastolic pressure (mm Hg): below 80. Prehypertension  Systolic pressure (mm Hg): 120 to 139.  Diastolic pressure (mm Hg): 80 to 89. Stage1  Systolic pressure (mm Hg): 140 to 159.  Diastolic pressure (mm Hg): 90 to 99. Stage2  Systolic pressure (mm Hg): 160 or above.  Diastolic pressure (mm Hg): 100 or above. RISKS RELATED TO HIGH BLOOD PRESSURE Managing your blood pressure is an important responsibility. Uncontrolled high blood pressure can lead to:  A heart attack.  A stroke.  A weakened blood vessel (aneurysm).  Heart failure.  Kidney damage.  Eye damage.  Metabolic syndrome.  Memory and concentration problems. HOW TO MANAGE YOUR BLOOD PRESSURE Blood pressure can be managed effectively with lifestyle changes and medicines (if needed). Your caregiver will help you come up with a plan to bring your blood pressure within a normal range. Your plan should include the following: Education  Read all information provided by your caregivers about how to control blood pressure.  Educate yourself on the latest guidelines and treatment recommendations. New research is always being done to further define the risks and treatments for high blood pressure. Lifestylechanges  Control your weight.  Avoid smoking.  Stay physically active.  Reduce the amount of salt in your diet.  Reduce stress.  Control any chronic conditions, such as high cholesterol or diabetes.  Reduce your alcohol intake. Medicines  Several medicines (antihypertensive medicines) are available, if needed, to bring blood pressure within a normal range. Communication  Review all the medicines you take with your  caregiver because there may be side effects or interactions.  Talk with your caregiver about your diet, exercise habits, and other lifestyle factors that may be contributing to high blood pressure.  See your caregiver regularly. Your caregiver can help you create and adjust your plan for managing high blood pressure. RECOMMENDATIONS FOR TREATMENT AND FOLLOW-UP  The following recommendations are based on current guidelines for managing high blood pressure in nonpregnant adults. Use these recommendations to identify the proper follow-up period or treatment option based on your blood pressure reading. You can discuss these options with your caregiver.  Systolic pressure of 867 to 619 or diastolic pressure of 80 to 89: Follow up with your caregiver as directed.  Systolic pressure of 509 to 326 or diastolic pressure of 90 to 100: Follow up with your caregiver within 2 months.  Systolic pressure above 712 or diastolic pressure above 458: Follow up with your caregiver within 1 month.  Systolic pressure above 099 or diastolic pressure above 833: Consider antihypertensive therapy; follow up with your caregiver within 1 week.  Systolic pressure above 825 or diastolic pressure above 053: Begin antihypertensive therapy; follow up with your caregiver within 1 week. Document Released: 06/14/2012 Document Reviewed: 06/14/2012 [facility] Patient Information 2014 [place], [place].

## 2013-10-10 NOTE — Progress Notes (Signed)
Pre visit review using our clinic review tool, if applicable. No additional management support is needed unless otherwise documented below in the visit note/SLS  

## 2013-10-10 NOTE — Progress Notes (Signed)
Patient ID: Corey Nguyen, male   DOB: 11-Dec-1965, 48 y.o.   MRN: [id number]  Patient presents to clinic today for 67-month follow-up of HTN.  Patient's BP at today's visit is 122/84.  Patient endorses watching his intake of salt.  Patient has also started jogging on a treadmill for at least 30 minutes, every other day.  Patient denies chest pain, palpitations, lightheadedness, vision changes or headache. No new concerns.  Past Medical History  Diagnosis Date  . Elevated blood pressure   . Idiopathic gout of knee   . Chicken pox     childhood  . Measles     childhood  . Mumps     childhood    Current Outpatient Prescriptions on File Prior to Visit  Medication Sig Dispense Refill  . fluticasone (FLONASE) 50 MCG/ACT nasal spray Place 2 sprays into the nose daily.  16 g  6  . lisinopril-hydrochlorothiazide (PRINZIDE,ZESTORETIC) 10-12.5 MG per tablet Take 1 tablet by mouth daily.  30 tablet  3  . oxymetazoline (AFRIN) 0.05 % nasal spray Place 2 sprays into the nose at bedtime as needed for congestion.       No current facility-administered medications on file prior to visit.    No Known Allergies  Family History  Problem Relation Age of Onset  . Hypercholesterolemia Father     Living  . Hypercholesterolemia Brother   . Healthy Mother     Living  . Diabetes Maternal Grandfather     Insulin  . Heart defect Maternal Grandmother   . Prostate cancer Paternal Grandfather   . Healthy Paternal Grandmother     Living  . Healthy Paternal Uncle   . Diabetes Maternal Aunt   . Healthy Maternal Aunt   . Alzheimer's disease Maternal Grandmother   . Migraines Daughter   . Healthy Other     Siblings    History   Social History  . Marital Status: Married    Spouse Name: N/A    Number of Children: N/A  . Years of Education: N/A   Social History Main Topics  . Smoking status: Never Smoker   . Smokeless tobacco: Former Systems developer  . Alcohol Use: 14.4 oz/week    24 Cans of beer per week   . Drug Use: No  . Sexual Activity: None   Other Topics Concern  . None   Social History Narrative  . None    Review of Systems - See HPI.  All other ROS are negative.  Filed Vitals:   10/10/13 0723  BP: 122/84  Pulse: 73  Temp: 97.9 F (36.6 C)  Resp: 16   Physical Exam  Vitals reviewed. Constitutional: He is oriented to person, place, and time and well-developed, well-nourished, and in no distress.  HENT:  Head: Normocephalic and atraumatic.  Eyes: Conjunctivae are normal. Pupils are equal, round, and reactive to light.  Neck: Neck supple.  Cardiovascular: Normal rate, regular rhythm, normal heart sounds and intact distal pulses.   Pulmonary/Chest: Effort normal and breath sounds normal. No respiratory distress. He has no wheezes. He has no rales. He exhibits no tenderness.  Lymphadenopathy:    He has no cervical adenopathy.  Neurological: He is alert and oriented to person, place, and time.  Skin: Skin is warm and dry. No rash noted.  Psychiatric: Affect normal.    Recent Results (from the past 2160 hour(s))  COMPREHENSIVE METABOLIC PANEL     Status: Abnormal   Collection Time    08/24/13  9:27  AM      Result Value Range   Sodium 138  135 - 145 mEq/L   Potassium 4.6  3.5 - 5.3 mEq/L   Chloride 103  96 - 112 mEq/L   CO2 24  19 - 32 mEq/L   Glucose, Bld 86  70 - 99 mg/dL   BUN 14  6 - 23 mg/dL   Creat 0.88  0.50 - 1.35 mg/dL   Total Bilirubin 0.7  0.3 - 1.2 mg/dL   Alkaline Phosphatase 66  39 - 117 U/L   AST 41 (*) 0 - 37 U/L   ALT 58 (*) 0 - 53 U/L   Total Protein 7.4  6.0 - 8.3 g/dL   Albumin 4.6  3.5 - 5.2 g/dL   Calcium 9.0  8.4 - 10.5 mg/dL  LIPID PANEL     Status: Abnormal   Collection Time    08/24/13  9:27 AM      Result Value Range   Cholesterol 192  0 - 200 mg/dL   Comment: ATP III Classification:           < 200        mg/dL        Desirable          200 - 239     mg/dL        Borderline High          >= 240        mg/dL        High          Triglycerides 195 (*) <150 mg/dL   HDL 46  >39 mg/dL   Total CHOL/HDL Ratio 4.2     VLDL 39  0 - 40 mg/dL   LDL Cholesterol 107 (*) 0 - 99 mg/dL   Comment:       Total Cholesterol/HDL Ratio:CHD Risk                            Coronary Heart Disease Risk Table                                            Men       Women              1/2 Average Risk              3.4        3.3                  Average Risk              5.0        4.4               2X Average Risk              9.6        7.1               3X Average Risk             23.4       11.0     Use the calculated Patient Ratio above and the CHD Risk table      to determine the patient's CHD Risk.     ATP III Classification (LDL):           <  100        mg/dL         Optimal          100 - 129     mg/dL         Near or Above Optimal          130 - 159     mg/dL         Borderline High          160 - 189     mg/dL         High           > 190        mg/dL         Very High          Assessment/Plan: No problem-specific assessment & plan notes found for this encounter.

## 2013-10-11 ENCOUNTER — Telehealth: Payer: Self-pay | Admitting: Physician Assistant

## 2013-10-11 NOTE — Telephone Encounter (Signed)
Relevant patient education assigned to patient using Emmi. ° °

## 2013-10-24 ENCOUNTER — Ambulatory Visit (INDEPENDENT_AMBULATORY_CARE_PROVIDER_SITE_OTHER): Payer: BC Managed Care – PPO | Admitting: Physician Assistant

## 2013-10-24 ENCOUNTER — Encounter: Payer: Self-pay | Admitting: Physician Assistant

## 2013-10-24 VITALS — BP 126/98 | HR 79 | Temp 98.2°F | Resp 18 | Ht 74.0 in | Wt 275.5 lb

## 2013-10-24 DIAGNOSIS — J209 Acute bronchitis, unspecified: Secondary | ICD-10-CM

## 2013-10-24 MED ORDER — HYDROCODONE-HOMATROPINE 5-1.5 MG/5ML PO SYRP
5.0000 mL | ORAL_SOLUTION | Freq: Four times a day (QID) | ORAL | Status: DC | PRN
Start: 2013-10-24 — End: 2014-04-10

## 2013-10-24 MED ORDER — AZITHROMYCIN 250 MG PO TABS: ORAL_TABLET | ORAL | Status: DC

## 2013-10-24 NOTE — Assessment & Plan Note (Signed)
Increase fluid intake.  Rest.  Hycodan for cough.  Place a humidifier in the bedroom.  Take Mucinex as directed.  Patient instructed his bronchitis is still likely viral.  Given printed Rx Azithromycin to only begin taking if symptoms are not improving over 24-48 hours.

## 2013-10-24 NOTE — Progress Notes (Signed)
Pre visit review using our clinic review tool, if applicable. No additional management support is needed unless otherwise documented below in the visit note/SLS  

## 2013-10-24 NOTE — Progress Notes (Signed)
Patient presents to clinic today c/o 6 days of a non-productive cough, nasal congestions, chest congestion, sinus pressure and postnasal drip.  Symptoms began gradually.  Patient endorses fever at onset of symptoms that has since dissipated.  Denies history of asthma or significant allergy.  Denies shortness of breath or wheezing.  Endorses fatigue from lack of sleep due to persistent nighttime coughing.  Patient has been taking OTC Mucinex with some relief of symptoms.  Past Medical History  Diagnosis Date  . Elevated blood pressure   . Idiopathic gout of knee   . Chicken pox     childhood  . Measles     childhood  . Mumps     childhood    Current Outpatient Prescriptions on File Prior to Visit  Medication Sig Dispense Refill  . fluticasone (FLONASE) 50 MCG/ACT nasal spray Place 2 sprays into the nose daily.  16 g  6  . lisinopril-hydrochlorothiazide (PRINZIDE,ZESTORETIC) 10-12.5 MG per tablet Take 1 tablet by mouth daily.  30 tablet  3  . oxymetazoline (AFRIN) 0.05 % nasal spray Place 2 sprays into the nose at bedtime as needed for congestion.       No current facility-administered medications on file prior to visit.    No Known Allergies  Family History  Problem Relation Age of Onset  . Hypercholesterolemia Father     Living  . Hypercholesterolemia Brother   . Healthy Mother     Living  . Diabetes Maternal Grandfather     Insulin  . Heart defect Maternal Grandmother   . Prostate cancer Paternal Grandfather   . Healthy Paternal Grandmother     Living  . Healthy Paternal Uncle   . Diabetes Maternal Aunt   . Healthy Maternal Aunt   . Alzheimer's disease Maternal Grandmother   . Migraines Daughter   . Healthy Other     Siblings    History   Social History  . Marital Status: Married    Spouse Name: N/A    Number of Children: N/A  . Years of Education: N/A   Social History Main Topics  . Smoking status: Never Smoker   . Smokeless tobacco: Former Systems developer  . Alcohol  Use: 14.4 oz/week    24 Cans of beer per week  . Drug Use: No  . Sexual Activity: None   Other Topics Concern  . None   Social History Narrative  . None   Review of Systems - See HPI.  All other ROS are negative.  Filed Vitals:   10/24/13 0715  BP: 126/98  Pulse: 79  Temp: 98.2 F (36.8 C)  Resp: 18   Physical Exam  Vitals reviewed. Constitutional: He is oriented to person, place, and time and well-developed, well-nourished, and in no distress.  HENT:  Head: Normocephalic and atraumatic.  Right Ear: External ear normal.  Left Ear: External ear normal.  Nose: Nose normal.  Mouth/Throat: Oropharynx is clear and moist. No oropharyngeal exudate.  TM within normal limits bilaterally.  Eyes: Conjunctivae are normal. Pupils are equal, round, and reactive to light.  Neck: Neck supple.  Cardiovascular: Normal rate, regular rhythm and normal heart sounds.   Pulmonary/Chest: Effort normal and breath sounds normal. No respiratory distress. He has no wheezes. He has no rales. He exhibits no tenderness.  Lymphadenopathy:    He has no cervical adenopathy.  Neurological: He is alert and oriented to person, place, and time.  Skin: Skin is warm and dry. No rash noted.  Psychiatric: Affect normal.  Recent Results (from the past 2160 hour(s))  COMPREHENSIVE METABOLIC PANEL     Status: Abnormal   Collection Time    08/24/13  9:27 AM      Result Value Range   Sodium 138  135 - 145 mEq/L   Potassium 4.6  3.5 - 5.3 mEq/L   Chloride 103  96 - 112 mEq/L   CO2 24  19 - 32 mEq/L   Glucose, Bld 86  70 - 99 mg/dL   BUN 14  6 - 23 mg/dL   Creat 0.88  0.50 - 1.35 mg/dL   Total Bilirubin 0.7  0.3 - 1.2 mg/dL   Alkaline Phosphatase 66  39 - 117 U/L   AST 41 (*) 0 - 37 U/L   ALT 58 (*) 0 - 53 U/L   Total Protein 7.4  6.0 - 8.3 g/dL   Albumin 4.6  3.5 - 5.2 g/dL   Calcium 9.0  8.4 - 10.5 mg/dL  LIPID PANEL     Status: Abnormal   Collection Time    08/24/13  9:27 AM      Result Value  Range   Cholesterol 192  0 - 200 mg/dL   Comment: ATP III Classification:           < 200        mg/dL        Desirable          200 - 239     mg/dL        Borderline High          >= 240        mg/dL        High         Triglycerides 195 (*) <150 mg/dL   HDL 46  >39 mg/dL   Total CHOL/HDL Ratio 4.2     VLDL 39  0 - 40 mg/dL   LDL Cholesterol 107 (*) 0 - 99 mg/dL   Comment:       Total Cholesterol/HDL Ratio:CHD Risk                            Coronary Heart Disease Risk Table                                            Men       Women              1/2 Average Risk              3.4        3.3                  Average Risk              5.0        4.4               2X Average Risk              9.6        7.1               3X Average Risk             23.4       11.0     Use the calculated Patient Ratio above and  the CHD Risk table      to determine the patient's CHD Risk.     ATP III Classification (LDL):           < 100        mg/dL         Optimal          100 - 129     mg/dL         Near or Above Optimal          130 - 159     mg/dL         Borderline High          160 - 189     mg/dL         High           > 190        mg/dL         Very High          Assessment/Plan: Acute bronchitis Increase fluid intake.  Rest.  Hycodan for cough.  Place a humidifier in the bedroom.  Take Mucinex as directed.  Patient instructed his bronchitis is still likely viral.  Given printed Rx Azithromycin to only begin taking if symptoms are not improving over 24-48 hours.

## 2013-10-24 NOTE — Patient Instructions (Signed)
Increase fluid intake.  Rest.  Hycodan for cough.  Place a humidifier in the bedroom.  Take Mucinex as directed.  If symtpoms not improving within 24 hours, please get the Azithromycin filled.  Please call or return to clinic if symptoms are not improving.

## 2013-12-29 ENCOUNTER — Other Ambulatory Visit: Payer: Self-pay | Admitting: Physician Assistant

## 2014-04-10 ENCOUNTER — Encounter: Payer: Self-pay | Admitting: Physician Assistant

## 2014-04-10 ENCOUNTER — Ambulatory Visit (INDEPENDENT_AMBULATORY_CARE_PROVIDER_SITE_OTHER): Payer: BC Managed Care – PPO | Admitting: Physician Assistant

## 2014-04-10 VITALS — BP 138/100 | HR 70 | Temp 97.9°F | Resp 16 | Ht 74.0 in | Wt 285.5 lb

## 2014-04-10 DIAGNOSIS — R0683 Snoring: Secondary | ICD-10-CM

## 2014-04-10 DIAGNOSIS — R0989 Other specified symptoms and signs involving the circulatory and respiratory systems: Secondary | ICD-10-CM

## 2014-04-10 DIAGNOSIS — K1379 Other lesions of oral mucosa: Secondary | ICD-10-CM | POA: Insufficient documentation

## 2014-04-10 DIAGNOSIS — R0609 Other forms of dyspnea: Secondary | ICD-10-CM

## 2014-04-10 DIAGNOSIS — I10 Essential (primary) hypertension: Secondary | ICD-10-CM

## 2014-04-10 DIAGNOSIS — K1370 Unspecified lesions of oral mucosa: Secondary | ICD-10-CM

## 2014-04-10 MED ORDER — LISINOPRIL-HYDROCHLOROTHIAZIDE 10-12.5 MG PO TABS: ORAL_TABLET | ORAL | Status: DC

## 2014-04-10 NOTE — Assessment & Plan Note (Signed)
Will begin with ENT referral giving small oropharynx and enlarged structures.  If ENT workup unremarkable for cause of obstruction, will proceed with sleep study.

## 2014-04-10 NOTE — Assessment & Plan Note (Signed)
Possible culprit, along with enlarged tonsils, of difficulty breathing with laying down.  Referral to ENT placed for evaluation.  Suspect possible OSA due to obstruction by oropharyngeal structures.

## 2014-04-10 NOTE — Assessment & Plan Note (Signed)
Medications refilled.  Take as directed.  Continue DASH diet.  Weight loss encouraged.  Follow-up in 6 months with labs.

## 2014-04-10 NOTE — Progress Notes (Signed)
Pre visit review using our clinic review tool, if applicable. No additional management support is needed unless otherwise documented below in the visit note/SLS  

## 2014-04-10 NOTE — Progress Notes (Signed)
Patient presents to clinic today for follow-up of hypertension.  Patient has been out of medication for 1 month.  BP 138/100 in clinic today.  Patient denies chest pain, palpitations, vision changes, frequent headaches, lightheadedness or dizziness.    Patient also wishes to discuss difficulty breathing at night due to nasal congestion and elongated uvula.  Patient with history of nighttime nasal congestion unresponsive to Flonase or antihistamines. Endorses snoring and daytime somnolence.  Patient has history of Afrin overuse. Denies hx of smoking. Denies hx of deviated septum or nasal polyps. Body mass index is 36.64 kg/(m^2).   Past Medical History  Diagnosis Date  . Elevated blood pressure   . Idiopathic gout of knee   . Chicken pox     childhood  . Measles     childhood  . Mumps     childhood    No current outpatient prescriptions on file prior to visit.   No current facility-administered medications on file prior to visit.    No Known Allergies  Family History  Problem Relation Age of Onset  . Hypercholesterolemia Father     Living  . Hypercholesterolemia Brother   . Healthy Mother     Living  . Diabetes Maternal Grandfather     Insulin  . Heart defect Maternal Grandmother   . Prostate cancer Paternal Grandfather   . Healthy Paternal Grandmother     Living  . Healthy Paternal Uncle   . Diabetes Maternal Aunt   . Healthy Maternal Aunt   . Alzheimer's disease Maternal Grandmother   . Migraines Daughter   . Healthy Other     Siblings    History   Social History  . Marital Status: Married    Spouse Name: N/A    Number of Children: N/A  . Years of Education: N/A   Social History Main Topics  . Smoking status: Never Smoker   . Smokeless tobacco: Former Systems developer  . Alcohol Use: 14.4 oz/week    24 Cans of beer per week  . Drug Use: No  . Sexual Activity: None   Other Topics Concern  . None   Social History Narrative  . None   Review of Systems - See HPI.   All other ROS are negative.  BP 138/100  Pulse 70  Temp(Src) 97.9 F (36.6 C) (Oral)  Resp 16  Ht 6\' 2"  (1.88 m)  Wt 285 lb 8 oz (129.502 kg)  BMI 36.64 kg/m2  SpO2 98%  Physical Exam  Constitutional: He is oriented to person, place, and time and well-developed, well-nourished, and in no distress.  HENT:  Head: Normocephalic and atraumatic.  Right Ear: Tympanic membrane, external ear and ear canal normal.  Left Ear: Tympanic membrane, external ear and ear canal normal.  Nose: No nasal deformity or septal deviation.  Mouth/Throat: Uvula is midline, oropharynx is clear and moist and mucous membranes are normal.  Elongated Uvula noted on examination.  Tonsils 2+ bilaterally.  Small oropharyngeal cavity noted on examination.  Eyes: Conjunctivae are normal.  Neck: Neck supple.  Cardiovascular: Normal rate, regular rhythm, normal heart sounds and intact distal pulses.   Pulmonary/Chest: Effort normal and breath sounds normal. No respiratory distress. He has no wheezes. He has no rales. He exhibits no tenderness.  Lymphadenopathy:    He has no cervical adenopathy.  Neurological: He is alert and oriented to person, place, and time.  Skin: Skin is warm and dry. No rash noted.  Psychiatric: Affect normal.   Assessment/Plan: Essential hypertension,  benign Medications refilled.  Take as directed.  Continue DASH diet.  Weight loss encouraged.  Follow-up in 6 months with labs.  Uvular hypertrophy Possible culprit, along with enlarged tonsils, of difficulty breathing with laying down.  Referral to ENT placed for evaluation.  Suspect possible OSA due to obstruction by oropharyngeal structures.  Snoring Will begin with ENT referral giving small oropharynx and enlarged structures.  If ENT workup unremarkable for cause of obstruction, will proceed with sleep study.

## 2014-04-10 NOTE — Patient Instructions (Signed)
Please continue medications as directed.  You will be contacted by ENT for evaluation of your symptoms.  Please let me know what they have decided for you.  If they do not feel your uvula and other oral structures are contributing to your symptoms, then we will proceed with sleep study evaluation.

## 2014-06-25 ENCOUNTER — Encounter (HOSPITAL_BASED_OUTPATIENT_CLINIC_OR_DEPARTMENT_OTHER): Payer: BC Managed Care – PPO

## 2014-08-22 ENCOUNTER — Other Ambulatory Visit: Payer: Self-pay | Admitting: Physician Assistant

## 2014-08-23 NOTE — Telephone Encounter (Signed)
Med filled.  

## 2014-09-17 ENCOUNTER — Ambulatory Visit (HOSPITAL_BASED_OUTPATIENT_CLINIC_OR_DEPARTMENT_OTHER): Payer: BC Managed Care – PPO | Attending: Otolaryngology | Admitting: Radiology

## 2014-09-17 DIAGNOSIS — G4733 Obstructive sleep apnea (adult) (pediatric): Secondary | ICD-10-CM | POA: Diagnosis not present

## 2014-09-17 DIAGNOSIS — G4719 Other hypersomnia: Secondary | ICD-10-CM

## 2014-09-17 DIAGNOSIS — Z6835 Body mass index (BMI) 35.0-35.9, adult: Secondary | ICD-10-CM | POA: Diagnosis not present

## 2014-09-17 DIAGNOSIS — G4730 Sleep apnea, unspecified: Secondary | ICD-10-CM | POA: Diagnosis present

## 2014-09-17 DIAGNOSIS — G4710 Hypersomnia, unspecified: Secondary | ICD-10-CM | POA: Diagnosis present

## 2014-09-17 NOTE — Sleep Study (Signed)
THE PATIENT ARRIVED TO THE [place] AT 5397 TO UNDERGO A HSAT.THE PATIENT ARRIVED TO THE [place] IN NO NOTED DISTRESS.THE [facility] DEMONSTRATED THE APPLICATION OF THE HSAT DEVICE. THE PATIENT PRESENTED UNDERSTANDING DURING THE TEACH METHOD.THE PATIENT WAS INSTRUCTED TO RETURN THE DEVICE ON THE NEXT BUSINESS DAY.THE PATIENT AGREED.

## 2014-09-21 DIAGNOSIS — G4719 Other hypersomnia: Secondary | ICD-10-CM

## 2014-09-21 DIAGNOSIS — G4710 Hypersomnia, unspecified: Secondary | ICD-10-CM

## 2014-09-21 DIAGNOSIS — G4730 Sleep apnea, unspecified: Secondary | ICD-10-CM

## 2014-09-21 NOTE — Sleep Study (Signed)
    NAME: Corey Nguyen DATE OF BIRTH:  July 01, 1966 MEDICAL RECORD NUMBER [id number]  LOCATION: [facility]  PHYSICIAN: Ivar Domangue D  DATE OF STUDY: 09/17/2014  SLEEP STUDY TYPE: Out of Center Sleep Test                REFERRING PHYSICIAN: Melissa Montane, MD  INDICATION FOR STUDY: Hypersomnia with sleep apnea  EPWORTH SLEEPINESS SCORE:   11/24 HEIGHT:   6 feet 2 inches WEIGHT:   275 pounds   BMI: 35 NECK SIZE:   19 in.  MEDICATIONS: Charted for review  IMPRESSION:  Moderate obstructive sleep apnea/hypopnea syndrome, AHI 16.8 per hour. 78 total events scored including 13 central apneas, 18 obstructive apneas, 3 mixed apneas, 44 hypopneas. Sleep position not specified. Snoring with oxygen desaturation to a nadir of 86% and average oxygen saturation 93% on room air. Mean heart rate 74.7/ minute.    RECOMMENDATION:  Scores in this range are usually addressed first with CPAP. Other interventions may be appropriate on an individual basis.   Deneise Lever Diplomate, American Board of Sleep Medicine  ELECTRONICALLY SIGNED ON:  09/21/2014, 10:12 AM [name] PH: [phone]   FX: [phone] [name]

## 2014-09-23 ENCOUNTER — Encounter: Payer: Self-pay | Admitting: Physician Assistant

## 2014-09-23 ENCOUNTER — Ambulatory Visit (INDEPENDENT_AMBULATORY_CARE_PROVIDER_SITE_OTHER): Payer: BC Managed Care – PPO | Admitting: Physician Assistant

## 2014-09-23 VITALS — BP 140/88 | HR 78 | Temp 98.2°F | Resp 16 | Ht 74.0 in | Wt 285.5 lb

## 2014-09-23 DIAGNOSIS — Z1211 Encounter for screening for malignant neoplasm of colon: Secondary | ICD-10-CM | POA: Insufficient documentation

## 2014-09-23 DIAGNOSIS — I10 Essential (primary) hypertension: Secondary | ICD-10-CM

## 2014-09-23 MED ORDER — LISINOPRIL-HYDROCHLOROTHIAZIDE 10-12.5 MG PO TABS: 1.0000 | ORAL_TABLET | Freq: Every day | ORAL | Status: DC

## 2014-09-23 NOTE — Assessment & Plan Note (Signed)
BP stable.  Continue current regimen. Medications refilled.  DASH diet again encouraged.  Follow-up 6 months.

## 2014-09-23 NOTE — Progress Notes (Signed)
   Patient presents to clinic today for follow-up of hypertension and to discuss screening colonoscopy.    Patient continues to take his lisinopril-hydrochlorothiazide daily as directed.  Is not checking BP at home. Endorses taking medication just before appointment this morning.  BP is at 140/88.  Denies chest pain, palpitations, lightheadedness, dizziness or vision changes.    In terms of colonoscopy, patient denies family history of colorectal cancer.  Denies abdominal pain, nausea/vomting, tenesmus, melena or hematochezia.  Is taking a fiber supplement and staying well hydrated.    Past Medical History  Diagnosis Date  . Elevated blood pressure   . Idiopathic gout of knee   . Chicken pox     childhood  . Measles     childhood  . Mumps     childhood    Current Outpatient Prescriptions on File Prior to Visit  Medication Sig Dispense Refill  . psyllium (METAMUCIL) 58.6 % powder Take 1 packet by mouth daily.     No current facility-administered medications on file prior to visit.    No Known Allergies  Family History  Problem Relation Age of Onset  . Hypercholesterolemia Father     Living  . Hypercholesterolemia Brother   . Healthy Mother     Living  . Diabetes Maternal Grandfather     Insulin  . Heart defect Maternal Grandmother   . Prostate cancer Paternal Grandfather   . Healthy Paternal Grandmother     Living  . Healthy Paternal Uncle   . Diabetes Maternal Aunt   . Healthy Maternal Aunt   . Alzheimer's disease Maternal Grandmother   . Migraines Daughter   . Healthy Other     Siblings    History   Social History  . Marital Status: Married    Spouse Name: N/A    Number of Children: N/A  . Years of Education: N/A   Social History Main Topics  . Smoking status: Never Smoker   . Smokeless tobacco: Former Systems developer  . Alcohol Use: 14.4 oz/week    24 Cans of beer per week  . Drug Use: No  . Sexual Activity: None   Other Topics Concern  . None   Social  History Narrative   Review of Systems - See HPI.  All other ROS are negative.  BP 140/88 mmHg  Pulse 78  Temp(Src) 98.2 F (36.8 C) (Oral)  Resp 16  Ht 6\' 2"  (1.88 m)  Wt 285 lb 8 oz (129.502 kg)  BMI 36.64 kg/m2  SpO2 99%  Physical Exam  Constitutional: He is oriented to person, place, and time and well-developed, well-nourished, and in no distress.  HENT:  Head: Normocephalic and atraumatic.  Eyes: Conjunctivae are normal.  Neck: Neck supple.  Cardiovascular: Normal rate, regular rhythm, normal heart sounds and intact distal pulses.   Pulmonary/Chest: Effort normal and breath sounds normal. No respiratory distress. He has no wheezes. He has no rales. He exhibits no tenderness.  Neurological: He is alert and oriented to person, place, and time.  Skin: Skin is warm and dry. No rash noted.  Psychiatric: Affect normal.  Vitals reviewed.  Assessment/Plan: Essential hypertension, benign BP stable.  Continue current regimen. Medications refilled.  DASH diet again encouraged.  Follow-up 6 months.  Colon cancer screening Discussed risks and benefits of colorectal cancer screening. Patient wishes to proceed with screening via colonoscopy.  Referral placed to GI.

## 2014-09-23 NOTE — Assessment & Plan Note (Signed)
Discussed risks and benefits of colorectal cancer screening. Patient wishes to proceed with screening via colonoscopy.  Referral placed to GI.

## 2014-09-23 NOTE — Progress Notes (Signed)
Pre visit review using our clinic review tool, if applicable. No additional management support is needed unless otherwise documented below in the visit note/SLS  

## 2014-09-23 NOTE — Patient Instructions (Signed)
Please continue BP medications as directed.  Watch your salt intake.  You will be contacted by Gastroenterology for a consult for a screening colonoscopy.  Follow-up with me in 6 months.  Return sooner if needed.  Colorectal Cancer Screening Colorectal cancer screening is done to detect early disease. Colorectal refers to the colon and rectum. The colon and rectum are located at the end of the large intestine (digestive system), and carry your bowel movements out of the body. Screening may be done even if you are not experiencing symptoms.  Colorectal cancer screening checks for:  Polyps. These are small growths in the lining of the colon that can turn cancerous.  Cancer that is already growing. Cancer is a cluster of abnormal cells that can cause problems in the body. REASONS FOR COLORECTAL CANCER SCREENING  It is common for polyps to form in the lining of the colon, especially in older people. These polyps can be cancerous or become cancerous.  Caught early, colorectal cancer is treatable.  Cancer can be life threatening. Detecting or preventing cancer early can save your life and allow you to enjoy life longer. TYPES OF SCREENING  Fecal occult blood testing. A stool sample is examined for blood in the laboratory.  Sigmoidoscopy. A sigmoidoscope is used to examine the rectum and lower colon. A sigmoidoscope is a flexible tube with a camera that is inserted through your anus to examine your lower rectum.  Colonoscopy. The longer colonoscope is used to examine the entire colon. A colonoscope is also a thin, flexible tube with a camera. This test examines the colon and rectum. Other tests include:  Digital rectal exam.  Barium enema.  Stool DNA test.  Virtual colonoscopy is the use of computerized X-ray scan (computed tomography, CT) to take X-ray images of your colon. WHO SHOULD HAVE COLORECTAL CANCER SCREENING?  Screening is recommended for all adults aged 49 to 70 years.    Screening is generally done every 5 to 10 years or more frequently if you have a family history or symptoms.  Screening is rarely recommended in adults aged 34 to 41 years. Screening is not recommended in adults aged 59 years and older. Your caregiver may recommend screening at a younger age and more frequent screening if you have:  A history of colorectal cancer or polyps.  Family members with histories of colorectal cancer or polyps.  Inflammatory bowel disease, such as ulcerative colitis or Crohn's disease.  A type of hereditary colon cancer syndrome. Talk with your caregiver about any symptoms, personal and family history. SYMPTOMS OF COLORECTAL CANCER It is important to discuss the following symptoms with your caregiver. These symptoms may be the result of other conditions and may be easily treated:  Rectal bleeding.  Blood in your stool.  Changes in bowel movements (hard or loose stools). These changes may last several weeks.  Abdominal cramping.  Feeling the pressure to have a bowel movement when there is no bowel movement.  Feeling tired or weak.  Unexplained weight loss.  Unexplained low red blood cell count. This may also be called iron deficiency anemia. HOME CARE INSTRUCTIONS   Follow up with your caregiver as directed.  Follow all instructions for preparation before your test as well as after. PREVENTION  Following healthy lifestyle habits each day can reduce your chance of getting colorectal cancer and many other types of cancer:  Eat a healthy, well-balanced diet rich in fruits and vegetables and low in fats, sugars and cholesterol.  Stay active. Try  to exercise at least 4 to 6 times per week for 30 minutes.  Maintain a healthy weight. Ask your caregiver what a healthy weight range is for you.  Women should only drink 1 alcoholic drink per day. Men should only drink 2 alcoholic drinks per day.  Quit smoking. SEEK MEDICAL CARE IF:   You experience  abdominal or rectal symptoms (see Symptoms of Colorectal Cancer).  Your gastrointestinal issues (constipation, diarrhea) do not go away as expected.  You have questions or concerns. FOR MORE INFORMATION  American Academy of Family Physicians www.familydoctor.org  Centers for Disease Control and Prevention http://www.wolf.info/  [place] Preventive Services Task Force www.uspreventiveservicestaskforce.[place] www.cancer.org MAKE SURE YOU:   Understand these instructions.  Will watch your condition.  Will get help right away if you are not doing well or get worse. Always follow up with your caregiver to find out the results of your tests. Not all test results may be available during your visit. If your test results are not back during the visit, make an appointment with your caregiver to find out the results. Do not assume everything is normal if you have not heard from your caregiver or the medical facility. It is important for you to follow up on all of your test results.  Document Released: 03/10/2010 Document Revised: 12/13/2011 Document Reviewed: 12/27/2013 [facility] Patient Information 2015 [place], [place]. This information is not intended to replace advice given to you by your health care provider. Make sure you discuss any questions you have with your health care provider.

## 2014-10-01 ENCOUNTER — Encounter: Payer: Self-pay | Admitting: Physician Assistant

## 2015-03-25 ENCOUNTER — Ambulatory Visit (INDEPENDENT_AMBULATORY_CARE_PROVIDER_SITE_OTHER): Payer: BLUE CROSS/BLUE SHIELD | Admitting: Physician Assistant

## 2015-03-25 ENCOUNTER — Encounter: Payer: Self-pay | Admitting: *Deleted

## 2015-03-25 ENCOUNTER — Encounter: Payer: Self-pay | Admitting: Physician Assistant

## 2015-03-25 VITALS — BP 111/73 | HR 60 | Temp 97.9°F | Resp 16 | Ht 74.0 in | Wt 260.0 lb

## 2015-03-25 DIAGNOSIS — I10 Essential (primary) hypertension: Secondary | ICD-10-CM

## 2015-03-25 DIAGNOSIS — Z1211 Encounter for screening for malignant neoplasm of colon: Secondary | ICD-10-CM | POA: Diagnosis not present

## 2015-03-25 LAB — COMPREHENSIVE METABOLIC PANEL
ALK PHOS: 62 U/L (ref 39–117)
ALT: 27 U/L (ref 0–53)
AST: 25 U/L (ref 0–37)
Albumin: 4.3 g/dL (ref 3.5–5.2)
BUN: 11 mg/dL (ref 6–23)
CALCIUM: 9.4 mg/dL (ref 8.4–10.5)
CHLORIDE: 103 meq/L (ref 96–112)
CO2: 26 mEq/L (ref 19–32)
Creatinine, Ser: 0.84 mg/dL (ref 0.40–1.50)
GFR: 102.46 mL/min (ref >60.00)
Glucose, Bld: 98 mg/dL (ref 70–99)
POTASSIUM: 3.9 meq/L (ref 3.5–5.1)
SODIUM: 136 meq/L (ref 135–145)
Total Bilirubin: 0.9 mg/dL (ref 0.2–1.2)
Total Protein: 7.4 g/dL (ref 6.0–8.3)

## 2015-03-25 MED ORDER — LISINOPRIL-HYDROCHLOROTHIAZIDE 10-12.5 MG PO TABS: 1.0000 | ORAL_TABLET | Freq: Every day | ORAL | Status: DC

## 2015-03-25 NOTE — Progress Notes (Signed)
Pre visit review using our clinic review tool, if applicable. No additional management support is needed unless otherwise documented below in the visit note/SLS  

## 2015-03-25 NOTE — Patient Instructions (Signed)
Please continue diet and exercise regimen. Please continue blood pressure medications as directed.  Follow-up in 6 months for your Annual Exam.  Hypertension Hypertension, commonly called high blood pressure, is when the force of blood pumping through your arteries is too strong. Your arteries are the blood vessels that carry blood from your heart throughout your body. A blood pressure reading consists of a higher number over a lower number, such as 110/72. The higher number (systolic) is the pressure inside your arteries when your heart pumps. The lower number (diastolic) is the pressure inside your arteries when your heart relaxes. Ideally you want your blood pressure below 120/80. Hypertension forces your heart to work harder to pump blood. Your arteries may become narrow or stiff. Having hypertension puts you at risk for heart disease, stroke, and other problems.  RISK FACTORS Some risk factors for high blood pressure are controllable. Others are not.  Risk factors you cannot control include:   Race. You may be at higher risk if you are African American.  Age. Risk increases with age.  Gender. Men are at higher risk than women before age 92 years. After age 81, women are at higher risk than men. Risk factors you can control include:  Not getting enough exercise or physical activity.  Being overweight.  Getting too much fat, sugar, calories, or salt in your diet.  Drinking too much alcohol. SIGNS AND SYMPTOMS Hypertension does not usually cause signs or symptoms. Extremely high blood pressure (hypertensive crisis) may cause headache, anxiety, shortness of breath, and nosebleed. DIAGNOSIS  To check if you have hypertension, your health care provider will measure your blood pressure while you are seated, with your arm held at the level of your heart. It should be measured at least twice using the same arm. Certain conditions can cause a difference in blood pressure between your right  and left arms. A blood pressure reading that is higher than normal on one occasion does not mean that you need treatment. If one blood pressure reading is high, ask your health care provider about having it checked again. TREATMENT  Treating high blood pressure includes making lifestyle changes and possibly taking medicine. Living a healthy lifestyle can help lower high blood pressure. You may need to change some of your habits. Lifestyle changes may include:  Following the DASH diet. This diet is high in fruits, vegetables, and whole grains. It is low in salt, red meat, and added sugars.  Getting at least 2 hours of brisk physical activity every week.  Losing weight if necessary.  Not smoking.  Limiting alcoholic beverages.  Learning ways to reduce stress. If lifestyle changes are not enough to get your blood pressure under control, your health care provider may prescribe medicine. You may need to take more than one. Work closely with your health care provider to understand the risks and benefits. HOME CARE INSTRUCTIONS  Have your blood pressure rechecked as directed by your health care provider.   Take medicines only as directed by your health care provider. Follow the directions carefully. Blood pressure medicines must be taken as prescribed. The medicine does not work as well when you skip doses. Skipping doses also puts you at risk for problems.   Do not smoke.   Monitor your blood pressure at home as directed by your health care provider. SEEK MEDICAL CARE IF:   You think you are having a reaction to medicines taken.  You have recurrent headaches or feel dizzy.  You have swelling  in your ankles.  You have trouble with your vision. SEEK IMMEDIATE MEDICAL CARE IF:  You develop a severe headache or confusion.  You have unusual weakness, numbness, or feel faint.  You have severe chest or abdominal pain.  You vomit repeatedly.  You have trouble breathing. MAKE  SURE YOU:   Understand these instructions.  Will watch your condition.  Will get help right away if you are not doing well or get worse. Document Released: 09/20/2005 Document Revised: 02/04/2014 Document Reviewed: 07/13/2013 [facility] Patient Information 2015 [place], [place]. This information is not intended to replace advice given to you by your health care provider. Make sure you discuss any questions you have with your health care provider.

## 2015-03-25 NOTE — Assessment & Plan Note (Signed)
Well controlled.  Medications refilled.  Will check CMP today.  Follow-up 6 months at CPE. If BP remains stable will decrease medication.

## 2015-03-25 NOTE — Assessment & Plan Note (Signed)
Referral placed to GI for screening colonoscopy 

## 2015-03-25 NOTE — Progress Notes (Signed)
   Patient presents to clinic today for 6 month follow-up for hypertension.  Endorses taking BP medication as directed. Patient denies chest pain, palpitations, lightheadedness, dizziness, vision changes or frequent headaches.  Denies cough with ACEI medication.  Has lost around 30 pounds over the past year.  Past Medical History  Diagnosis Date  . Elevated blood pressure   . Idiopathic gout of knee   . Chicken pox     childhood  . Measles     childhood  . Mumps     childhood    Current Outpatient Prescriptions on File Prior to Visit  Medication Sig Dispense Refill  . psyllium (METAMUCIL) 58.6 % powder Take 1 packet by mouth daily.     No current facility-administered medications on file prior to visit.    No Known Allergies  Family History  Problem Relation Age of Onset  . Hypercholesterolemia Father     Living  . Hypercholesterolemia Brother   . Healthy Mother     Living  . Diabetes Maternal Grandfather     Insulin  . Heart defect Maternal Grandmother   . Prostate cancer Paternal Grandfather   . Healthy Paternal Grandmother     Living  . Healthy Paternal Uncle   . Diabetes Maternal Aunt   . Healthy Maternal Aunt   . Alzheimer's disease Maternal Grandmother   . Migraines Daughter   . Healthy Other     Siblings    History   Social History  . Marital Status: Married    Spouse Name: N/A  . Number of Children: N/A  . Years of Education: N/A   Social History Main Topics  . Smoking status: Never Smoker   . Smokeless tobacco: Former Systems developer  . Alcohol Use: 14.4 oz/week    24 Cans of beer per week  . Drug Use: No  . Sexual Activity: Not on file   Other Topics Concern  . None   Social History Narrative   Review of Systems - See HPI.  All other ROS are negative.  BP 111/73 mmHg  Pulse 60  Temp(Src) 97.9 F (36.6 C) (Oral)  Resp 16  Ht 6\' 2"  (1.88 m)  Wt 260 lb (117.935 kg)  BMI 33.37 kg/m2  SpO2 99%  Physical Exam  Constitutional: He is oriented to  person, place, and time and well-developed, well-nourished, and in no distress.  HENT:  Head: Normocephalic and atraumatic.  Eyes: Conjunctivae are normal.  Neck: Neck supple.  Cardiovascular: Normal rate, regular rhythm, normal heart sounds and intact distal pulses.   Pulmonary/Chest: Effort normal and breath sounds normal. No respiratory distress. He has no wheezes. He has no rales. He exhibits no tenderness.  Neurological: He is alert and oriented to person, place, and time.  Skin: Skin is warm and dry. No rash noted.  Psychiatric: Affect normal.  Vitals reviewed.    Assessment/Plan: Essential hypertension, benign Well controlled.  Medications refilled.  Will check CMP today.  Follow-up 6 months at CPE. If BP remains stable will decrease medication.  Colon cancer screening Referral placed to GI for screening colonoscopy.

## 2015-09-05 ENCOUNTER — Telehealth: Payer: Self-pay | Admitting: Behavioral Health

## 2015-09-05 ENCOUNTER — Encounter: Payer: Self-pay | Admitting: Behavioral Health

## 2015-09-05 NOTE — Telephone Encounter (Signed)
Pre-Visit Call completed with patient and chart updated.   Pre-Visit Info documented in Specialty Comments under [employer].    

## 2015-09-08 ENCOUNTER — Ambulatory Visit (INDEPENDENT_AMBULATORY_CARE_PROVIDER_SITE_OTHER): Payer: BLUE CROSS/BLUE SHIELD | Admitting: Physician Assistant

## 2015-09-08 ENCOUNTER — Encounter: Payer: Self-pay | Admitting: Physician Assistant

## 2015-09-08 VITALS — BP 128/71 | HR 60 | Temp 97.8°F | Ht 74.0 in | Wt 241.8 lb

## 2015-09-08 DIAGNOSIS — Z1159 Encounter for screening for other viral diseases: Secondary | ICD-10-CM

## 2015-09-08 DIAGNOSIS — Z0000 Encounter for general adult medical examination without abnormal findings: Secondary | ICD-10-CM | POA: Diagnosis not present

## 2015-09-08 DIAGNOSIS — I10 Essential (primary) hypertension: Secondary | ICD-10-CM | POA: Diagnosis not present

## 2015-09-08 DIAGNOSIS — Z23 Encounter for immunization: Secondary | ICD-10-CM | POA: Diagnosis not present

## 2015-09-08 DIAGNOSIS — Z125 Encounter for screening for malignant neoplasm of prostate: Secondary | ICD-10-CM | POA: Diagnosis not present

## 2015-09-08 DIAGNOSIS — Z114 Encounter for screening for human immunodeficiency virus [HIV]: Secondary | ICD-10-CM

## 2015-09-08 DIAGNOSIS — Z1211 Encounter for screening for malignant neoplasm of colon: Secondary | ICD-10-CM | POA: Diagnosis not present

## 2015-09-08 DIAGNOSIS — E669 Obesity, unspecified: Secondary | ICD-10-CM | POA: Insufficient documentation

## 2015-09-08 LAB — PSA: PSA: 0.86 ng/mL (ref 0.10–4.00)

## 2015-09-08 LAB — TSH: TSH: 0.77 u[IU]/mL (ref 0.35–4.50)

## 2015-09-08 LAB — CBC
HEMATOCRIT: 45 % (ref 39.0–52.0)
HEMOGLOBIN: 15.4 g/dL (ref 13.0–17.0)
MCHC: 34.2 g/dL (ref 30.0–36.0)
MCV: 84.3 fl (ref 78.0–100.0)
Platelets: 181 10*3/uL (ref 150.0–400.0)
RBC: 5.34 Mil/uL (ref 4.22–5.81)
RDW: 13.3 % (ref 11.5–15.5)
WBC: 5.5 10*3/uL (ref 4.0–10.5)

## 2015-09-08 LAB — URINALYSIS, ROUTINE W REFLEX MICROSCOPIC
Bilirubin Urine: NEGATIVE
KETONES UR: NEGATIVE
Leukocytes, UA: NEGATIVE
Nitrite: NEGATIVE
PH: 6 (ref 5.0–8.0)
SPECIFIC GRAVITY, URINE: 1.025 (ref 1.000–1.030)
TOTAL PROTEIN, URINE-UPE24: NEGATIVE
URINE GLUCOSE: NEGATIVE
UROBILINOGEN UA: 0.2 (ref 0.0–1.0)

## 2015-09-08 LAB — LIPID PANEL
CHOL/HDL RATIO: 4
CHOLESTEROL: 173 mg/dL (ref 0–200)
HDL: 40.2 mg/dL (ref >39.00)
NONHDL: 132.68
Triglycerides: 284 mg/dL — ABNORMAL HIGH (ref 0.0–149.0)
VLDL: 56.8 mg/dL — AB (ref 0.0–40.0)

## 2015-09-08 LAB — COMPREHENSIVE METABOLIC PANEL
ALK PHOS: 68 U/L (ref 39–117)
ALT: 16 U/L (ref 0–53)
AST: 19 U/L (ref 0–37)
Albumin: 4.4 g/dL (ref 3.5–5.2)
BILIRUBIN TOTAL: 0.9 mg/dL (ref 0.2–1.2)
BUN: 13 mg/dL (ref 6–23)
CHLORIDE: 102 meq/L (ref 96–112)
CO2: 28 meq/L (ref 19–32)
Calcium: 9.3 mg/dL (ref 8.4–10.5)
Creatinine, Ser: 0.83 mg/dL (ref 0.40–1.50)
GFR: 103.7 mL/min (ref >60.00)
GLUCOSE: 87 mg/dL (ref 70–99)
POTASSIUM: 4.1 meq/L (ref 3.5–5.1)
Sodium: 139 mEq/L (ref 135–145)
Total Protein: 7.2 g/dL (ref 6.0–8.3)

## 2015-09-08 LAB — LDL CHOLESTEROL, DIRECT: Direct LDL: 107 mg/dL

## 2015-09-08 LAB — HEMOGLOBIN A1C: HEMOGLOBIN A1C: 4.9 % (ref 4.6–6.5)

## 2015-09-08 LAB — HIV ANTIBODY (ROUTINE TESTING W REFLEX): HIV: NONREACTIVE

## 2015-09-08 NOTE — Patient Instructions (Addendum)
Please go to the lab for blood work.  I will call you with your results. If your blood work is normal we will follow-up yearly for physicals.  If anything is abnormal we will treat and get you in for follow-up.  Please continue your medications as directed.  [phone] -- call to schedule colonoscopy.  Preventive Care for Adults, Male A healthy lifestyle and preventive care can promote health and wellness. Preventive health guidelines for men include the following key practices:  A routine yearly physical is a good way to check with your health care provider about your health and preventative screening. It is a chance to share any concerns and updates on your health and to receive a thorough exam.  Visit your dentist for a routine exam and preventative care every 6 months. Brush your teeth twice a day and floss once a day. Good oral hygiene prevents tooth decay and gum disease.  The frequency of eye exams is based on your age, health, family medical history, use of contact lenses, and other factors. Follow your health care provider's recommendations for frequency of eye exams.  Eat a healthy diet. Foods such as vegetables, fruits, whole grains, low-fat dairy products, and lean protein foods contain the nutrients you need without too many calories. Decrease your intake of foods high in solid fats, added sugars, and salt. Eat the right amount of calories for you.Get information about a proper diet from your health care provider, if necessary.  Regular physical exercise is one of the most important things you can do for your health. Most adults should get at least 150 minutes of moderate-intensity exercise (any activity that increases your heart rate and causes you to sweat) each week. In addition, most adults need muscle-strengthening exercises on 2 or more days a week.  Maintain a healthy weight. The body mass index (BMI) is a screening tool to identify possible weight problems. It provides  an estimate of body fat based on height and weight. Your health care provider can find your BMI and can help you achieve or maintain a healthy weight.For adults 20 years and older:  A BMI below 18.5 is considered underweight.  A BMI of 18.5 to 24.9 is normal.  A BMI of 25 to 29.9 is considered overweight.  A BMI of 30 and above is considered obese.  Maintain normal blood lipids and cholesterol levels by exercising and minimizing your intake of saturated fat. Eat a balanced diet with plenty of fruit and vegetables. Blood tests for lipids and cholesterol should begin at age 51 and be repeated every 5 years. If your lipid or cholesterol levels are high, you are over 50, or you are at high risk for heart disease, you may need your cholesterol levels checked more frequently.Ongoing high lipid and cholesterol levels should be treated with medicines if diet and exercise are not working.  If you smoke, find out from your health care provider how to quit. If you do not use tobacco, do not start.  Lung cancer screening is recommended for adults aged 51-80 years who are at high risk for developing lung cancer because of a history of smoking. A yearly low-dose CT scan of the lungs is recommended for people who have at least a 30-pack-year history of smoking and are a current smoker or have quit within the past 15 years. A pack year of smoking is smoking an average of 1 pack of cigarettes a day for 1 year (for example: 1 pack a day  for 30 years or 2 packs a day for 15 years). Yearly screening should continue until the smoker has stopped smoking for at least 15 years. Yearly screening should be stopped for people who develop a health problem that would prevent them from having lung cancer treatment.  If you choose to drink alcohol, do not have more than 2 drinks per day. One drink is considered to be 12 ounces (355 mL) of beer, 5 ounces (148 mL) of wine, or 1.5 ounces (44 mL) of liquor.  Avoid use of street  drugs. Do not share needles with anyone. Ask for help if you need support or instructions about stopping the use of drugs.  High blood pressure causes heart disease and increases the risk of stroke. Your blood pressure should be checked at least every 1-2 years. Ongoing high blood pressure should be treated with medicines, if weight loss and exercise are not effective.  If you are 51-27 years old, ask your health care provider if you should take aspirin to prevent heart disease.  Diabetes screening is done by taking a blood sample to check your blood glucose level after you have not eaten for a certain period of time (fasting). If you are not overweight and you do not have risk factors for diabetes, you should be screened once every 3 years starting at age 37. If you are overweight or obese and you are 51-56 years of age, you should be screened for diabetes every year as part of your cardiovascular risk assessment.  Colorectal cancer can be detected and often prevented. Most routine colorectal cancer screening begins at the age of 37 and continues through age 53. However, your health care provider may recommend screening at an earlier age if you have risk factors for colon cancer. On a yearly basis, your health care provider may provide home test kits to check for hidden blood in the stool. Use of a small camera at the end of a tube to directly examine the colon (sigmoidoscopy or colonoscopy) can detect the earliest forms of colorectal cancer. Talk to your health care provider about this at age 51, when routine screening begins. Direct exam of the colon should be repeated every 5-10 years through age 65, unless early forms of precancerous polyps or small growths are found.  People who are at an increased risk for hepatitis B should be screened for this virus. You are considered at high risk for hepatitis B if:  You were born in a country where hepatitis B occurs often. Talk with your health care provider  about which countries are considered high risk.  Your parents were born in a high-risk country and you have not received a shot to protect against hepatitis B (hepatitis B vaccine).  You have HIV or AIDS.  You use needles to inject street drugs.  You live with, or have sex with, someone who has hepatitis B.  You are a man who has sex with other men (MSM).  You get hemodialysis treatment.  You take certain medicines for conditions such as cancer, organ transplantation, and autoimmune conditions.  Hepatitis C blood testing is recommended for all people born from [birth year] through 1965 and any individual with known risks for hepatitis C.  Practice safe sex. Use condoms and avoid high-risk sexual practices to reduce the spread of sexually transmitted infections (STIs). STIs include gonorrhea, chlamydia, syphilis, trichomonas, herpes, HPV, and human immunodeficiency virus (HIV). Herpes, HIV, and HPV are viral illnesses that have no cure. They can result  in disability, cancer, and death.  If you are a man who has sex with other men, you should be screened at least once per year for:  HIV.  Urethral, rectal, and pharyngeal infection of gonorrhea, chlamydia, or both.  If you are at risk of being infected with HIV, it is recommended that you take a prescription medicine daily to prevent HIV infection. This is called preexposure prophylaxis (PrEP). You are considered at risk if:  You are a man who has sex with other men (MSM) and have other risk factors.  You are a heterosexual man, are sexually active, and are at increased risk for HIV infection.  You take drugs by injection.  You are sexually active with a partner who has HIV.  Talk with your health care provider about whether you are at high risk of being infected with HIV. If you choose to begin PrEP, you should first be tested for HIV. You should then be tested every 3 months for as long as you are taking PrEP.  A one-time screening for  abdominal aortic aneurysm (AAA) and surgical repair of large AAAs by ultrasound are recommended for men ages 92 to 43 years who are current or former smokers.  Healthy men should no longer receive prostate-specific antigen (PSA) blood tests as part of routine cancer screening. Talk with your health care provider about prostate cancer screening.  Testicular cancer screening is not recommended for adult males who have no symptoms. Screening includes self-exam, a health care provider exam, and other screening tests. Consult with your health care provider about any symptoms you have or any concerns you have about testicular cancer.  Use sunscreen. Apply sunscreen liberally and repeatedly throughout the day. You should seek shade when your shadow is shorter than you. Protect yourself by wearing long sleeves, pants, a wide-brimmed hat, and sunglasses year round, whenever you are outdoors.  Once a month, do a whole-body skin exam, using a mirror to look at the skin on your back. Tell your health care provider about new moles, moles that have irregular borders, moles that are larger than a pencil eraser, or moles that have changed in shape or color.  Stay current with required vaccines (immunizations).  Influenza vaccine. All adults should be immunized every year.  Tetanus, diphtheria, and acellular pertussis (Td, Tdap) vaccine. An adult who has not previously received Tdap or who does not know his vaccine status should receive 1 dose of Tdap. This initial dose should be followed by tetanus and diphtheria toxoids (Td) booster doses every 10 years. Adults with an unknown or incomplete history of completing a 3-dose immunization series with Td-containing vaccines should begin or complete a primary immunization series including a Tdap dose. Adults should receive a Td booster every 10 years.  Varicella vaccine. An adult without evidence of immunity to varicella should receive 2 doses or a second dose if he has  previously received 1 dose.  Human papillomavirus (HPV) vaccine. Males aged 11-21 years who have not received the vaccine previously should receive the 3-dose series. Males aged 22-26 years may be immunized. Immunization is recommended through the age of 62 years for any male who has sex with males and did not get any or all doses earlier. Immunization is recommended for any person with an immunocompromised condition through the age of 72 years if he did not get any or all doses earlier. During the 3-dose series, the second dose should be obtained 4-8 weeks after the first dose. The third dose  should be obtained 24 weeks after the first dose and 16 weeks after the second dose.  Zoster vaccine. One dose is recommended for adults aged 43 years or older unless certain conditions are present.  Measles, mumps, and rubella (MMR) vaccine. Adults born before 65 generally are considered immune to measles and mumps. Adults born in [birth year] or later should have 1 or more doses of MMR vaccine unless there is a contraindication to the vaccine or there is laboratory evidence of immunity to each of the three diseases. A routine second dose of MMR vaccine should be obtained at least 28 days after the first dose for students attending postsecondary schools, health care workers, or international travelers. People who received inactivated measles vaccine or an unknown type of measles vaccine during 1963-1967 should receive 2 doses of MMR vaccine. People who received inactivated mumps vaccine or an unknown type of mumps vaccine before 1979 and are at high risk for mumps infection should consider immunization with 2 doses of MMR vaccine. Unvaccinated health care workers born before [birth year] who lack laboratory evidence of measles, mumps, or rubella immunity or laboratory confirmation of disease should consider measles and mumps immunization with 2 doses of MMR vaccine or rubella immunization with 1 dose of MMR vaccine.  Pneumococcal  13-valent conjugate (PCV13) vaccine. When indicated, a person who is uncertain of his immunization history and has no record of immunization should receive the PCV13 vaccine. All adults 26 years of age and older should receive this vaccine. An adult aged 30 years or older who has certain medical conditions and has not been previously immunized should receive 1 dose of PCV13 vaccine. This PCV13 should be followed with a dose of pneumococcal polysaccharide (PPSV23) vaccine. Adults who are at high risk for pneumococcal disease should obtain the PPSV23 vaccine at least 8 weeks after the dose of PCV13 vaccine. Adults older than 51 years of age who have normal immune system function should obtain the PPSV23 vaccine dose at least 1 year after the dose of PCV13 vaccine.  Pneumococcal polysaccharide (PPSV23) vaccine. When PCV13 is also indicated, PCV13 should be obtained first. All adults aged 68 years and older should be immunized. An adult younger than age 86 years who has certain medical conditions should be immunized. Any person who resides in a nursing home or long-term care facility should be immunized. An adult smoker should be immunized. People with an immunocompromised condition and certain other conditions should receive both PCV13 and PPSV23 vaccines. People with human immunodeficiency virus (HIV) infection should be immunized as soon as possible after diagnosis. Immunization during chemotherapy or radiation therapy should be avoided. Routine use of PPSV23 vaccine is not recommended for American Indians, [place] Natives, or people younger than 65 years unless there are medical conditions that require PPSV23 vaccine. When indicated, people who have unknown immunization and have no record of immunization should receive PPSV23 vaccine. One-time revaccination 5 years after the first dose of PPSV23 is recommended for people aged 19-64 years who have chronic kidney failure, nephrotic syndrome, asplenia, or  immunocompromised conditions. People who received 1-2 doses of PPSV23 before age 39 years should receive another dose of PPSV23 vaccine at age 55 years or later if at least 5 years have passed since the previous dose. Doses of PPSV23 are not needed for people immunized with PPSV23 at or after age 84 years.  Meningococcal vaccine. Adults with asplenia or persistent complement component deficiencies should receive 2 doses of quadrivalent meningococcal conjugate (MenACWY-D) vaccine. The doses should  be obtained at least 2 months apart. Microbiologists working with certain meningococcal bacteria, [employer] recruits, people at risk during an outbreak, and people who travel to or live in countries with a high rate of meningitis should be immunized. A first-year college student up through age 34 years who is living in a residence hall should receive a dose if he did not receive a dose on or after his 16th birthday. Adults who have certain high-risk conditions should receive one or more doses of vaccine.  Hepatitis A vaccine. Adults who wish to be protected from this disease, have chronic liver disease, work with hepatitis A-infected animals, work in hepatitis A research labs, or travel to or work in countries with a high rate of hepatitis A should be immunized. Adults who were previously unvaccinated and who anticipate close contact with an international adoptee during the first 60 days after arrival in the [place] from a country with a high rate of hepatitis A should be immunized.  Hepatitis B vaccine. Adults should be immunized if they wish to be protected from this disease, are under age 82 years and have diabetes, have chronic liver disease, have had more than one sex partner in the past 6 months, may be exposed to blood or other infectious body fluids, are household contacts or sex partners of hepatitis B positive people, are clients or workers in certain care facilities, or travel to or work in countries  with a high rate of hepatitis B.  Haemophilus influenzae type b (Hib) vaccine. A previously unvaccinated person with asplenia or sickle cell disease or having a scheduled splenectomy should receive 1 dose of Hib vaccine. Regardless of previous immunization, a recipient of a hematopoietic stem cell transplant should receive a 3-dose series 6-12 months after his successful transplant. Hib vaccine is not recommended for adults with HIV infection. Preventive Service / Frequency Ages 2 to 36  Blood pressure check.** / Every 3-5 years.  Lipid and cholesterol check.** / Every 5 years beginning at age 78.  Hepatitis C blood test.** / For any individual with known risks for hepatitis C.  Skin self-exam. / Monthly.  Influenza vaccine. / Every year.  Tetanus, diphtheria, and acellular pertussis (Tdap, Td) vaccine.** / Consult your health care provider. 1 dose of Td every 10 years.  Varicella vaccine.** / Consult your health care provider.  HPV vaccine. / 3 doses over 6 months, if 74 or younger.  Measles, mumps, rubella (MMR) vaccine.** / You need at least 1 dose of MMR if you were born in 1957 or later. You may also need a second dose.  Pneumococcal 13-valent conjugate (PCV13) vaccine.** / Consult your health care provider.  Pneumococcal polysaccharide (PPSV23) vaccine.** / 1 to 2 doses if you smoke cigarettes or if you have certain conditions.  Meningococcal vaccine.** / 1 dose if you are age 43 to 33 years and a Market researcher living in a residence hall, or have one of several medical conditions. You may also need additional booster doses.  Hepatitis A vaccine.** / Consult your health care provider.  Hepatitis B vaccine.** / Consult your health care provider.  Haemophilus influenzae type b (Hib) vaccine.** / Consult your health care provider. Ages 25 to 47  Blood pressure check.** / Every year.  Lipid and cholesterol check.** / Every 5 years beginning at age 59.  Lung  cancer screening. / Every year if you are aged 71-80 years and have a 30-pack-year history of smoking and currently smoke or have quit within  the past 15 years. Yearly screening is stopped once you have quit smoking for at least 15 years or develop a health problem that would prevent you from having lung cancer treatment.  Fecal occult blood test (FOBT) of stool. / Every year beginning at age 79 and continuing until age 5. You may not have to do this test if you get a colonoscopy every 10 years.  Flexible sigmoidoscopy** or colonoscopy.** / Every 5 years for a flexible sigmoidoscopy or every 10 years for a colonoscopy beginning at age 47 and continuing until age 52.  Hepatitis C blood test.** / For all people born from [birth year] through 1965 and any individual with known risks for hepatitis C.  Skin self-exam. / Monthly.  Influenza vaccine. / Every year.  Tetanus, diphtheria, and acellular pertussis (Tdap/Td) vaccine.** / Consult your health care provider. 1 dose of Td every 10 years.  Varicella vaccine.** / Consult your health care provider.  Zoster vaccine.** / 1 dose for adults aged 25 years or older.  Measles, mumps, rubella (MMR) vaccine.** / You need at least 1 dose of MMR if you were born in 1957 or later. You may also need a second dose.  Pneumococcal 13-valent conjugate (PCV13) vaccine.** / Consult your health care provider.  Pneumococcal polysaccharide (PPSV23) vaccine.** / 1 to 2 doses if you smoke cigarettes or if you have certain conditions.  Meningococcal vaccine.** / Consult your health care provider.  Hepatitis A vaccine.** / Consult your health care provider.  Hepatitis B vaccine.** / Consult your health care provider.  Haemophilus influenzae type b (Hib) vaccine.** / Consult your health care provider. Ages 37 and over  Blood pressure check.** / Every year.  Lipid and cholesterol check.**/ Every 5 years beginning at age 17.  Lung cancer screening. / Every year if you  are aged 57-80 years and have a 30-pack-year history of smoking and currently smoke or have quit within the past 15 years. Yearly screening is stopped once you have quit smoking for at least 15 years or develop a health problem that would prevent you from having lung cancer treatment.  Fecal occult blood test (FOBT) of stool. / Every year beginning at age 86 and continuing until age 16. You may not have to do this test if you get a colonoscopy every 10 years.  Flexible sigmoidoscopy** or colonoscopy.** / Every 5 years for a flexible sigmoidoscopy or every 10 years for a colonoscopy beginning at age 90 and continuing until age 44.  Hepatitis C blood test.** / For all people born from [birth year] through 1967 and any individual with known risks for hepatitis C.  Abdominal aortic aneurysm (AAA) screening.** / A one-time screening for ages 5 to 86 years who are current or former smokers.  Skin self-exam. / Monthly.  Influenza vaccine. / Every year.  Tetanus, diphtheria, and acellular pertussis (Tdap/Td) vaccine.** / 1 dose of Td every 10 years.  Varicella vaccine.** / Consult your health care provider.  Zoster vaccine.** / 1 dose for adults aged 9 years or older.  Pneumococcal 13-valent conjugate (PCV13) vaccine.** / 1 dose for all adults aged 30 years and older.  Pneumococcal polysaccharide (PPSV23) vaccine.** / 1 dose for all adults aged 78 years and older.  Meningococcal vaccine.** / Consult your health care provider.  Hepatitis A vaccine.** / Consult your health care provider.  Hepatitis B vaccine.** / Consult your health care provider.  Haemophilus influenzae type b (Hib) vaccine.** / Consult your health care provider. **Family history and personal history  of risk and conditions may change your health care provider's recommendations.   This information is not intended to replace advice given to you by your health care provider. Make sure you discuss any questions you have with your  health care provider.   Document Released: 11/16/2001 Document Revised: 10/11/2014 Document Reviewed: 02/15/2011 Elsevier Interactive Patient Education [employer].

## 2015-09-08 NOTE — Assessment & Plan Note (Signed)
Depression screen negative. Health Maintenance reviewed -- Colonoscopy to be scheduled. Flu shot declined. TDAP given today. Preventive schedule discussed and handout given in AVS. Will obtain fasting labs today.

## 2015-09-08 NOTE — Assessment & Plan Note (Signed)
Patient is exercising daily and working on diet. 150 minutes of aerobic exercise recommended per week. Discussed portion size and frequency of meals.

## 2015-09-08 NOTE — Assessment & Plan Note (Signed)
Stable. Asymptomatic. Will obtain fasting labs today. Continue current regimen.

## 2015-09-08 NOTE — Assessment & Plan Note (Signed)
Patient has been referred to GI for colonoscopy. Has been contacted by [facility] but did not schedule appointment. Number of [facility] given to patient to schedule procedure.

## 2015-09-08 NOTE — Progress Notes (Signed)
Pre visit review using our clinic review tool, if applicable. No additional management support is needed unless otherwise documented below in the visit note. 

## 2015-09-08 NOTE — Progress Notes (Signed)
Patient presents to clinic today for annual exam.  Patient is fasting for labs.  Acute Concerns: Denies acute concern today.  Chronic Issues: Hypertension -- Patient taking medications as directed. Patient denies chest pain, palpitations, lightheadedness, dizziness, vision changes or frequent headaches.  BP Readings from Last 3 Encounters:  09/08/15 128/71  03/25/15 111/73  09/23/14 140/88   Obesity -- Body mass index is 31.03 kg/(m^2). Is walking daily for exercise. Is trying to watch diet by eating smaller portion sizes and making better food choices.  Health Maintenance: Dental --up-to-date Vision -- up to date Immunizations -- Declines flu shot. Is unsure of Tetanus shot. Will be getting today. Colonoscopy -- Referral already placed. Patient will call to schedule  Past Medical History  Diagnosis Date  . Elevated blood pressure   . Idiopathic gout of knee   . Chicken pox     childhood  . Measles     childhood  . Mumps     childhood    Past Surgical History  Procedure Laterality Date  . Testicle removal      Age 35-Undescended    Current Outpatient Prescriptions on File Prior to Visit  Medication Sig Dispense Refill  . lisinopril-hydrochlorothiazide (PRINZIDE,ZESTORETIC) 10-12.5 MG per tablet Take 1 tablet by mouth daily. 90 tablet 1  . psyllium (METAMUCIL) 58.6 % powder Take 1 packet by mouth daily.     No current facility-administered medications on file prior to visit.    No Known Allergies  Family History  Problem Relation Age of Onset  . Hypercholesterolemia Father     Living  . Hypercholesterolemia Brother   . Healthy Mother     Living  . Diabetes Maternal Grandfather     Insulin  . Heart defect Maternal Grandmother   . Prostate cancer Paternal Grandfather   . Healthy Paternal Grandmother     Living  . Healthy Paternal Uncle   . Diabetes Maternal Aunt   . Healthy Maternal Aunt   . Alzheimer's disease Maternal Grandmother   . Migraines  Daughter   . Healthy Other     Siblings    Social History   Social History  . Marital Status: Married    Spouse Name: N/A  . Number of Children: N/A  . Years of Education: N/A   Occupational History  . Not on file.   Social History Main Topics  . Smoking status: Never Smoker   . Smokeless tobacco: Former Systems developer  . Alcohol Use: 14.4 oz/week    24 Cans of beer per week  . Drug Use: No  . Sexual Activity: Not on file   Other Topics Concern  . Not on file   Social History Narrative   Review of Systems  Constitutional: Negative for fever and weight loss.  HENT: Negative for ear discharge, ear pain, hearing loss and tinnitus.   Eyes: Negative for blurred vision, double vision, photophobia and pain.  Respiratory: Negative for cough and shortness of breath.   Cardiovascular: Negative for chest pain and palpitations.  Gastrointestinal: Negative for heartburn, nausea, vomiting, abdominal pain, diarrhea, constipation, blood in stool and melena.  Genitourinary: Negative for dysuria, urgency, frequency, hematuria and flank pain.  Musculoskeletal: Negative for falls.  Neurological: Negative for dizziness, loss of consciousness and headaches.  Endo/Heme/Allergies: Negative for environmental allergies.  Psychiatric/Behavioral: Negative for depression, suicidal ideas, hallucinations and substance abuse. The patient is not nervous/anxious and does not have insomnia.    BP 128/71 mmHg  Pulse 60  Temp(Src) 97.8  F (36.6 C) (Oral)  Ht 6\' 2"  (1.88 m)  Wt 241 lb 12.8 oz (109.68 kg)  BMI 31.03 kg/m2  SpO2 100%  Physical Exam  Constitutional: He is oriented to person, place, and time and well-developed, well-nourished, and in no distress.  HENT:  Head: Normocephalic and atraumatic.  Right Ear: External ear normal.  Left Ear: External ear normal.  Nose: Nose normal.  Mouth/Throat: Oropharynx is clear and moist. No oropharyngeal exudate.  Eyes: Conjunctivae and EOM are normal. Pupils  are equal, round, and reactive to light.  Neck: Neck supple. No thyromegaly present.  Cardiovascular: Normal rate, regular rhythm, normal heart sounds and intact distal pulses.   Pulmonary/Chest: Effort normal and breath sounds normal. No respiratory distress. He has no wheezes. He has no rales. He exhibits no tenderness.  Abdominal: Soft. Bowel sounds are normal. He exhibits no distension and no mass. There is no tenderness. There is no rebound and no guarding.  Genitourinary: Testes/scrotum normal and penis normal. No discharge found.  Lymphadenopathy:    He has no cervical adenopathy.  Neurological: He is alert and oriented to person, place, and time.  Skin: Skin is warm and dry. No rash noted.  Psychiatric: Affect normal.  Vitals reviewed.   No results found for this or any previous visit (from the past 2160 hour(s)).  Assessment/Plan: Colon cancer screening Patient has been referred to GI for colonoscopy. Has been contacted by [facility] but did not schedule appointment. Number of [facility] given to patient to schedule procedure.  Essential hypertension, benign Stable. Asymptomatic. Will obtain fasting labs today. Continue current regimen.  Obesity (BMI 30-39.9) Patient is exercising daily and working on diet. 150 minutes of aerobic exercise recommended per week. Discussed portion size and frequency of meals.  Visit for preventive health examination Depression screen negative. Health Maintenance reviewed -- Colonoscopy to be scheduled. Flu shot declined. TDAP given today. Preventive schedule discussed and handout given in AVS. Will obtain fasting labs today.

## 2015-09-09 LAB — HEPATITIS C ANTIBODY: HCV AB: NEGATIVE

## 2015-10-14 ENCOUNTER — Other Ambulatory Visit: Payer: Self-pay | Admitting: *Deleted

## 2015-10-14 DIAGNOSIS — I10 Essential (primary) hypertension: Secondary | ICD-10-CM

## 2015-10-14 MED ORDER — LISINOPRIL-HYDROCHLOROTHIAZIDE 10-12.5 MG PO TABS
1.0000 | ORAL_TABLET | Freq: Every day | ORAL | Status: DC
Start: 2015-10-14 — End: 2016-04-16

## 2015-10-14 NOTE — Telephone Encounter (Signed)
Rx sent to the pharmacy by e-script.//AB/CMA 

## 2016-01-30 DIAGNOSIS — G4733 Obstructive sleep apnea (adult) (pediatric): Secondary | ICD-10-CM | POA: Diagnosis not present

## 2016-02-29 DIAGNOSIS — G4733 Obstructive sleep apnea (adult) (pediatric): Secondary | ICD-10-CM | POA: Diagnosis not present

## 2016-03-31 DIAGNOSIS — G4733 Obstructive sleep apnea (adult) (pediatric): Secondary | ICD-10-CM | POA: Diagnosis not present

## 2016-04-16 ENCOUNTER — Other Ambulatory Visit: Payer: Self-pay | Admitting: Physician Assistant

## 2016-04-16 NOTE — Telephone Encounter (Signed)
Rx request to pharmacy/SLS Requested drug refills are authorized, however, the patient needs further evaluation and/or laboratory testing before further refills are given. Ask him to make an appointment for this.  Please call patient and schedule Follow-up appointment, overdue for 6 mth, per provider/SLS 07/14 Thanks.

## 2016-04-20 NOTE — Telephone Encounter (Signed)
Spoke with wife Stanton Kidney) and informed her that he needs to schedule appt prior to any future refills. States she will inform the patient and have him call to schedule according to his work schedule.

## 2016-04-30 DIAGNOSIS — G4733 Obstructive sleep apnea (adult) (pediatric): Secondary | ICD-10-CM | POA: Diagnosis not present

## 2016-07-19 ENCOUNTER — Other Ambulatory Visit: Payer: Self-pay | Admitting: Physician Assistant

## 2016-08-22 ENCOUNTER — Other Ambulatory Visit: Payer: Self-pay | Admitting: Physician Assistant

## 2016-10-01 ENCOUNTER — Other Ambulatory Visit: Payer: Self-pay | Admitting: Physician Assistant

## 2016-10-20 ENCOUNTER — Other Ambulatory Visit: Payer: Self-pay | Admitting: Physician Assistant

## 2016-12-02 DIAGNOSIS — G4733 Obstructive sleep apnea (adult) (pediatric): Secondary | ICD-10-CM | POA: Diagnosis not present

## 2016-12-30 ENCOUNTER — Telehealth: Payer: Self-pay | Admitting: Physician Assistant

## 2016-12-30 NOTE — Telephone Encounter (Signed)
Pt was informed an scheduled an appt with Dr Nani Ravens.

## 2016-12-30 NOTE — Telephone Encounter (Signed)
-----   Message from Brunetta Jeans, PA-C sent at 12/30/2016  8:10 AM EDT ----- Regarding: RE: Transferring Pt Contact: [phone] Fine with me. Please put these transfers in phone notes so we can keep track of them. Thank you!  ----- Message ----- From: Rosalin Hawking Sent: 12/30/2016   8:06 AM To: Brunetta Jeans, PA-C, # Subject: Transferring Pt                                Pt would like to be tranferred from Brunetta Jeans to Dr Nani Ravens since the office at [facility] too far out for pt. Please advise if ok.

## 2017-01-03 ENCOUNTER — Ambulatory Visit (INDEPENDENT_AMBULATORY_CARE_PROVIDER_SITE_OTHER): Payer: BLUE CROSS/BLUE SHIELD | Admitting: Family Medicine

## 2017-01-03 ENCOUNTER — Encounter: Payer: Self-pay | Admitting: Family Medicine

## 2017-01-03 VITALS — BP 130/100 | HR 65 | Temp 97.7°F | Ht 74.0 in | Wt 275.6 lb

## 2017-01-03 DIAGNOSIS — E781 Pure hyperglyceridemia: Secondary | ICD-10-CM | POA: Diagnosis not present

## 2017-01-03 DIAGNOSIS — Z1211 Encounter for screening for malignant neoplasm of colon: Secondary | ICD-10-CM

## 2017-01-03 DIAGNOSIS — I10 Essential (primary) hypertension: Secondary | ICD-10-CM | POA: Diagnosis not present

## 2017-01-03 DIAGNOSIS — Z8739 Personal history of other diseases of the musculoskeletal system and connective tissue: Secondary | ICD-10-CM

## 2017-01-03 DIAGNOSIS — Z0000 Encounter for general adult medical examination without abnormal findings: Secondary | ICD-10-CM

## 2017-01-03 LAB — COMPREHENSIVE METABOLIC PANEL
ALT: 25 U/L (ref 0–53)
AST: 22 U/L (ref 0–37)
Albumin: 4.4 g/dL (ref 3.5–5.2)
Alkaline Phosphatase: 63 U/L (ref 39–117)
BUN: 14 mg/dL (ref 6–23)
CALCIUM: 9.2 mg/dL (ref 8.4–10.5)
CHLORIDE: 104 meq/L (ref 96–112)
CO2: 27 meq/L (ref 19–32)
CREATININE: 0.88 mg/dL (ref 0.40–1.50)
GFR: 96.43 mL/min (ref >60.00)
Glucose, Bld: 89 mg/dL (ref 70–99)
Potassium: 4.1 mEq/L (ref 3.5–5.1)
Sodium: 138 mEq/L (ref 135–145)
Total Bilirubin: 0.6 mg/dL (ref 0.2–1.2)
Total Protein: 7.2 g/dL (ref 6.0–8.3)

## 2017-01-03 LAB — URIC ACID: Uric Acid, Serum: 6.2 mg/dL (ref 4.0–7.8)

## 2017-01-03 LAB — LIPID PANEL
CHOLESTEROL: 176 mg/dL (ref 0–200)
HDL: 41.2 mg/dL (ref >39.00)
NonHDL: 134.4
Total CHOL/HDL Ratio: 4
Triglycerides: 225 mg/dL — ABNORMAL HIGH (ref 0.0–149.0)
VLDL: 45 mg/dL — AB (ref 0.0–40.0)

## 2017-01-03 LAB — LDL CHOLESTEROL, DIRECT: LDL DIRECT: 109 mg/dL

## 2017-01-03 MED ORDER — LISINOPRIL-HYDROCHLOROTHIAZIDE 10-12.5 MG PO TABS: 1.0000 | ORAL_TABLET | Freq: Every day | ORAL | 1 refills | Status: DC

## 2017-01-03 NOTE — Progress Notes (Signed)
Chief Complaint  Patient presents with  . Transitions Of Care    pt is requesting CPE-fasting    Well Male Corey Nguyen is here for a complete physical.   His last physical was >1 year ago.  Current diet: in general, a "healthy" diet   Current exercise: walking dog Weight trend: stable Does pt snore? No. Has CPAP Daytime fatigue? No. Seat belt? Yes.    Health maintenance Colonoscopy- No- was asking about Cologard Tetanus- Yes 12/16 HIV- Yes Hep C- Yes Prostate cancer screening- Yes   Past Medical History:  Diagnosis Date  . Chicken pox    childhood  . Elevated blood pressure   . Idiopathic gout of knee   . Measles    childhood  . Mumps    childhood    Past Surgical History:  Procedure Laterality Date  . TESTICLE REMOVAL     Age 47-Undescended   Medications  Has not been taking any meds routinely, used to be on Prinzide 10-12.5 mg daily.   Allergies No Known Allergies   Family History Family History  Problem Relation Age of Onset  . Hypercholesterolemia Father     Living  . Hypercholesterolemia Brother   . Healthy Mother     Living  . Diabetes Maternal Grandfather     Insulin  . Heart defect Maternal Grandmother   . Alzheimer's disease Maternal Grandmother   . Prostate cancer Paternal Grandfather   . Healthy Paternal Grandmother     Living  . Healthy Paternal Uncle   . Diabetes Maternal Aunt   . Healthy Maternal Aunt   . Migraines Daughter   . Healthy Other     Siblings    Review of Systems: Constitutional:  no unexpected change in weight, no fevers or chills Eye:  no recent significant change in vision Ear/Nose/Mouth/Throat:  Ears:  no tinnitus or hearing loss Nose/Mouth/Throat:  no complaints of nasal congestion or bleeding, no sore throat and oral sores Cardiovascular:  no chest pain, no palpitations Respiratory:  no cough and no shortness of breath Gastrointestinal:  no abdominal pain, no change in bowel habits, no nausea, vomiting,  diarrhea, or constipation and no black or bloody stool GU:  Male: negative for dysuria, frequency, and incontinence and negative for prostate symptoms Musculoskeletal/Extremities: +intermittent flares of pain in R first MTP- none currently, otherwise no pain, redness, or swelling of the joints Integumentary (Skin/Breast):  no abnormal skin lesions reported Neurologic:  no headaches, no numbness, tingling Endocrine:  weight changes, masses in the neck, heat/cold intolerance, bowel or skin changes, or cardiovascular system symptoms Hematologic/Lymphatic:  no abnormal bleeding, no HIV risk factors, no night sweats, no swollen nodes, no weight loss  Exam BP (!) 130/100 (BP Location: Left Arm, Patient Position: Sitting, Cuff Size: Large)   Pulse 65   Temp 97.7 F (36.5 C) (Oral)   Ht 6\' 2"  (1.88 m)   Wt 275 lb 9.6 oz (125 kg)   SpO2 99%   BMI 35.38 kg/m  General:  well developed, well nourished, in no apparent distress Skin:  no significant moles, warts, or growths Head:  no masses, lesions, or tenderness Eyes:  pupils equal and round, sclera anicteric without injection Ears:  canals without lesions, TMs shiny without retraction, no obvious effusion, no erythema Nose:  nares patent, septum midline, mucosa normal Throat/Pharynx:  lips and gingiva without lesion; tongue and uvula midline; non-inflamed pharynx; no exudates or postnasal drainage Neck: neck supple without adenopathy, thyromegaly, or masses Lungs:  clear to auscultation, breath sounds equal bilaterally, no respiratory distress Cardio:  regular rate and rhythm without murmurs, heart sounds without clicks or rubs Abdomen:  abdomen soft, nontender; bowel sounds normal; no masses or organomegaly Genital (male): circumcised penis, no lesions or discharge; Unilateral testis present on R, no masses or tenderness Rectal: Deferred Musculoskeletal:  symmetrical muscle groups noted without atrophy or deformity Extremities:  no clubbing,  cyanosis, or edema, no deformities, no skin discoloration Neuro:  gait normal; deep tendon reflexes normal and symmetric Psych: well oriented with normal range of affect and appropriate judgment/insight  Assessment and Plan  Well adult exam - Plan: Comprehensive metabolic panel  Hypertriglyceridemia - Plan: Lipid panel  Essential hypertension, benign - Plan: lisinopril-hydrochlorothiazide (PRINZIDE,ZESTORETIC) 10-12.5 MG tablet  Screen for colon cancer - Plan: Ambulatory referral to Gastroenterology  History of gout - Plan: Uric acid   Well 51 y.o. male. Counseled on diet and exercise. Gout handout given as well as healthy heart diet. Discussed Cologard vs colonoscopy and pt is willing to do colonoscopy at this time. Restart Prinzide. Encouraged pt to get home BP monitor and check BP intermittently. Will discuss PSA at next CPE. Immunizations, labs, and further orders as above. Follow up in 3 mo pending above. The patient voiced understanding and agreement to the plan.  [place], DO 01/03/17 8:10 AM

## 2017-01-03 NOTE — Patient Instructions (Signed)
Foods to AVOID: Red meat, organ meat (liver), lunch meat, seafood (mussels, scallops, anchovies, etc) Alcohol Sugary foods/beverages (diet soft drinks have no link to flares)  Foods to migrate to: Dairy Vegetables Cherries have limited data to suggest they help lower uric acid levels (and prevent flares) Vit C (500 mg daily) may have a modest effect with preventing flares Poultry If you are going to eat red meat, beef and pork may give you less problems than lamb.   Healthy Eating Plan Many factors influence your heart health, including eating and exercise habits. Heart (coronary) risk increases with abnormal blood fat (lipid) levels. Heart-healthy meal planning includes limiting unhealthy fats, increasing healthy fats, and making other small dietary changes. This includes maintaining a healthy body weight to help keep lipid levels within a normal range.  WHAT IS MY PLAN?  Your health care provider recommends that you:  Drink a glass of water before meals to help with satiety.  Eat slowly.  An alternative to the water is to add Metamucil. This will help with satiety as well. It does contain calories, unlike water.  WHAT TYPES OF FAT SHOULD I CHOOSE?  Choose healthy fats more often. Choose monounsaturated and polyunsaturated fats, such as olive oil and canola oil, flaxseeds, walnuts, almonds, and seeds.  Eat more omega-3 fats. Good choices include salmon, mackerel, sardines, tuna, flaxseed oil, and ground flaxseeds. Aim to eat fish at least two times each week.  Avoid foods with partially hydrogenated oils in them. These contain trans fats. Examples of foods that contain trans fats are stick margarine, some tub margarines, cookies, crackers, and other baked goods. If you are going to avoid a fat, this is the one to avoid!  WHAT GENERAL GUIDELINES DO I NEED TO FOLLOW?  Check food labels carefully to identify foods with trans fats. Avoid these types of options when possible.  Fill  one half of your plate with vegetables and green salads. Eat 4-5 servings of vegetables per day. A serving of vegetables equals 1 cup of raw leafy vegetables,  cup of raw or cooked cut-up vegetables, or  cup of vegetable juice.  Fill one fourth of your plate with whole grains. Look for the word "whole" as the first word in the ingredient list.  Fill one fourth of your plate with lean protein foods.  Eat 4-5 servings of fruit per day. A serving of fruit equals one medium whole fruit,  cup of dried fruit,  cup of fresh, frozen, or canned fruit. Try to avoid fruits in cups/syrups as the sugar content can be high.  Eat more foods that contain soluble fiber. Examples of foods that contain this type of fiber are apples, broccoli, carrots, beans, peas, and barley. Aim to get 20-30 g of fiber per day.  Eat more home-cooked food and less restaurant, buffet, and fast food.  Limit or avoid alcohol.  Limit foods that are high in starch and sugar.  Avoid fried foods when able.  Cook foods by using methods other than frying. Baking, boiling, grilling, and broiling are all great options. Other fat-reducing suggestions include: ? Removing the skin from poultry. ? Removing all visible fats from meats. ? Skimming the fat off of stews, soups, and gravies before serving them. ? Steaming vegetables in water or broth.  Lose weight if you are overweight. Losing just 5-10% of your initial body weight can help your overall health and prevent diseases such as diabetes and heart disease.  Increase your consumption of nuts, legumes,  and seeds to 4-5 servings per week. One serving of dried beans or legumes equals  cup after being cooked, one serving of nuts equals 1 ounces, and one serving of seeds equals  ounce or 1 tablespoon.  WHAT ARE GOOD FOODS CAN I EAT? Grains Grainy breads (try to find bread that is 3 g of fiber per slice or greater), oatmeal, light popcorn. Whole-grain cereals. Rice and pasta,  including brown rice and those that are made with whole wheat. Edamame pasta is a great alternative to grain pasta. It has a higher protein content. Try to avoid significant consumption of white bread, sugary cereals, or pastries/baked goods.  Vegetables All vegetables. Cooked white potatoes do not count as vegetables.  Fruits All fruits, but limit pineapple and bananas as these fruits have a higher sugar content.  Meats and Other Protein Sources Lean, well-trimmed beef, veal, pork, and lamb. Chicken and [place] without skin. All fish and shellfish. Wild duck, rabbit, pheasant, and venison. Egg whites or low-cholesterol egg substitutes. Dried beans, peas, lentils, and tofu.Seeds and most nuts.  Dairy Low-fat or nonfat cheeses, including ricotta, string, and mozzarella. Skim or 1% milk that is liquid, powdered, or evaporated. Buttermilk that is made with low-fat milk. Nonfat or low-fat yogurt. Soy/Almond milk are good alternatives if you cannot handle dairy.  Beverages Water is the best for you. Sports drinks with less sugar are more desirable unless you are a highly active athlete.  Sweets and Desserts Sherbets and fruit ices. Honey, jam, marmalade, jelly, and syrups. Dark chocolate.  Eat all sweets and desserts in moderation.  Fats and Oils Nonhydrogenated (trans-free) margarines. Vegetable oils, including soybean, sesame, sunflower, olive, peanut, safflower, corn, canola, and cottonseed. Salad dressings or mayonnaise that are made with a vegetable oil. Limit added fats and oils that you use for cooking, baking, salads, and as spreads.  Other Cocoa powder. Coffee and tea. Most condiments.  The items listed above may not be a complete list of recommended foods or beverages. Contact your dietitian for more options.

## 2017-01-03 NOTE — Progress Notes (Signed)
Pre visit review using our clinic review tool, if applicable. No additional management support is needed unless otherwise documented below in the visit note. 

## 2017-01-06 ENCOUNTER — Ambulatory Visit (INDEPENDENT_AMBULATORY_CARE_PROVIDER_SITE_OTHER): Payer: BLUE CROSS/BLUE SHIELD | Admitting: Family Medicine

## 2017-01-06 ENCOUNTER — Encounter: Payer: Self-pay | Admitting: Family Medicine

## 2017-01-06 VITALS — BP 116/76 | HR 64 | Temp 97.5°F | Ht 74.0 in | Wt 270.2 lb

## 2017-01-06 DIAGNOSIS — E781 Pure hyperglyceridemia: Secondary | ICD-10-CM

## 2017-01-06 DIAGNOSIS — M1A9XX0 Chronic gout, unspecified, without tophus (tophi): Secondary | ICD-10-CM

## 2017-01-06 MED ORDER — NAPROXEN 500 MG PO TABS: ORAL_TABLET | ORAL | 2 refills | Status: DC

## 2017-01-06 NOTE — Patient Instructions (Addendum)
If your labs look good at follow up, we will cancel your appt. Make sure you are fasting prior to appointment.

## 2017-01-06 NOTE — Progress Notes (Signed)
Pre visit review using our clinic review tool, if applicable. No additional management support is needed unless otherwise documented below in the visit note. 

## 2017-01-06 NOTE — Progress Notes (Signed)
Chief Complaint  Patient presents with  . Follow-up    discuss recent lab results    Subjective: Patient is a 51 y.o. male here for lab result f/u.  Found to have continued hypertriglyceridemia. 10 yr CVD is 6.5%. His diet is poor overall, but since 3 days ago, has started to make changes. He does not exercise routinely and is contemplating starting. No CP or SOB.  Uric acid 6.2, not on any gout prophylaxis. Over the past year, he has had approx 4-5 flares, usually affecting his R MTP. He was recently given a dietary guide over which foods to avoid to help mitigate flares.    ROS: Heart: Denies chest pain  Lungs: Denies SOB   Family History  Problem Relation Age of Onset  . Hypercholesterolemia Father     Living  . Hypercholesterolemia Brother   . Healthy Mother     Living  . Diabetes Maternal Grandfather     Insulin  . Heart defect Maternal Grandmother   . Alzheimer's disease Maternal Grandmother   . Prostate cancer Paternal Grandfather   . Healthy Paternal Grandmother     Living  . Healthy Paternal Uncle   . Diabetes Maternal Aunt   . Healthy Maternal Aunt   . Migraines Daughter   . Healthy Other     Siblings   Past Medical History:  Diagnosis Date  . Chicken pox    childhood  . Elevated blood pressure   . Idiopathic gout of knee   . Measles    childhood  . Mumps    childhood   No Known Allergies  Current Outpatient Prescriptions:  .  lisinopril-hydrochlorothiazide (PRINZIDE,ZESTORETIC) 10-12.5 MG tablet, Take 1 tablet by mouth daily., Disp: 90 tablet, Rfl: 1 .  Multiple Vitamin (MULTI VITAMIN MENS PO), Take 1 tablet by mouth daily., Disp: , Rfl:  .  Omega-3 Fatty Acids (FISH OIL) 1000 MG CAPS, Take 1 capsule by mouth daily., Disp: , Rfl:  .  Wheat Dextrin (BENEFIBER DRINK MIX PO), Take by mouth. Take as directed., Disp: , Rfl:  .  naproxen (NAPROSYN) 500 MG tablet, When you have a flare, take 2 tabs daily for 5 days., Disp: 30 tablet, Rfl:  2  Objective: BP 116/76 (BP Location: Left Arm, Patient Position: Sitting, Cuff Size: Large)   Pulse 64   Temp 97.5 F (36.4 C) (Oral)   Ht 6\' 2"  (1.88 m)   Wt 270 lb 3.2 oz (122.6 kg)   SpO2 97%   BMI 34.69 kg/m  General: Awake, appears stated age Heart: RRR, no murmurs, no LE edema Lungs: CTAB, no rales, wheezes or rhonchi. No accessory muscle use Psych: Age appropriate judgment and insight, normal affect and mood  Assessment and Plan: Chronic gout involving toe of right foot without tophus, unspecified cause - Plan: naproxen (NAPROSYN) 500 MG tablet  Hypertriglyceridemia - Plan: Lipid panel  Orders as above. Discussed starting a daily medicine vs monitoring flares and using naproxen when he does. Pt opted for latter. Will let us know if he changes his mind and number of flares bothers him more than idea of taking medicine daily. Offered statin, will trial lifestyle modifications first. Counseled on diet and exercise. Will recheck lipid panel in 3 mo and follow up based on these future results. The patient voiced understanding and agreement to the plan.  [place], DO 01/06/17  7:36 AM

## 2017-02-02 ENCOUNTER — Encounter: Payer: Self-pay | Admitting: Gastroenterology

## 2017-02-02 ENCOUNTER — Encounter: Payer: Self-pay | Admitting: Family Medicine

## 2017-03-08 ENCOUNTER — Ambulatory Visit (AMBULATORY_SURGERY_CENTER): Payer: Self-pay | Admitting: *Deleted

## 2017-03-08 VITALS — Ht 74.0 in | Wt 272.4 lb

## 2017-03-08 DIAGNOSIS — Z1211 Encounter for screening for malignant neoplasm of colon: Secondary | ICD-10-CM

## 2017-03-08 MED ORDER — NA SULFATE-K SULFATE-MG SULF 17.5-3.13-1.6 GM/177ML PO SOLN: ORAL | 0 refills | Status: DC

## 2017-03-08 NOTE — Progress Notes (Signed)
Pt denies allergies to eggs or soy products. Denies difficulty with sedation or anesthesia. Denies any diet or weight loss medications. Denies use of supplemental oxygen.  Emmi instructions given for procedure.  

## 2017-03-11 ENCOUNTER — Encounter: Payer: Self-pay | Admitting: Gastroenterology

## 2017-03-21 ENCOUNTER — Encounter: Payer: Self-pay | Admitting: Gastroenterology

## 2017-03-21 ENCOUNTER — Ambulatory Visit (AMBULATORY_SURGERY_CENTER): Payer: BLUE CROSS/BLUE SHIELD | Admitting: Gastroenterology

## 2017-03-21 VITALS — BP 128/90 | HR 62 | Temp 98.9°F | Resp 12 | Ht 74.0 in | Wt 272.0 lb

## 2017-03-21 DIAGNOSIS — Z1212 Encounter for screening for malignant neoplasm of rectum: Secondary | ICD-10-CM | POA: Diagnosis not present

## 2017-03-21 DIAGNOSIS — K635 Polyp of colon: Secondary | ICD-10-CM | POA: Diagnosis not present

## 2017-03-21 DIAGNOSIS — D120 Benign neoplasm of cecum: Secondary | ICD-10-CM

## 2017-03-21 DIAGNOSIS — D125 Benign neoplasm of sigmoid colon: Secondary | ICD-10-CM | POA: Diagnosis not present

## 2017-03-21 DIAGNOSIS — Z1211 Encounter for screening for malignant neoplasm of colon: Secondary | ICD-10-CM | POA: Diagnosis not present

## 2017-03-21 MED ORDER — SODIUM CHLORIDE 0.9 % IV SOLN: 500.0000 mL | INTRAVENOUS | Status: DC

## 2017-03-21 NOTE — Progress Notes (Signed)
To recovery, report to Jones, RN, VSS 

## 2017-03-21 NOTE — Progress Notes (Signed)
Called to room to assist during endoscopic procedure.  Patient ID and intended procedure confirmed with present staff. Received instructions for my participation in the procedure from the performing physician.  

## 2017-03-21 NOTE — Progress Notes (Signed)
Pt's states no medical or surgical changes since previsit or office visit. 

## 2017-03-21 NOTE — Patient Instructions (Signed)
YOU HAD AN ENDOSCOPIC PROCEDURE TODAY AT THE [facility]:   Refer to the procedure report that was given to you for any specific questions about what was found during the examination.  If the procedure report does not answer your questions, please call your gastroenterologist to clarify.  If you requested that your care partner not be given the details of your procedure findings, then the procedure report has been included in a sealed envelope for you to review at your convenience later.  YOU SHOULD EXPECT: Some feelings of bloating in the abdomen. Passage of more gas than usual.  Walking can help get rid of the air that was put into your GI tract during the procedure and reduce the bloating. If you had a lower endoscopy (such as a colonoscopy or flexible sigmoidoscopy) you may notice spotting of blood in your stool or on the toilet paper. If you underwent a bowel prep for your procedure, you may not have a normal bowel movement for a few days.  Please Note:  You might notice some irritation and congestion in your nose or some drainage.  This is from the oxygen used during your procedure.  There is no need for concern and it should clear up in a day or so.  SYMPTOMS TO REPORT IMMEDIATELY:   Following lower endoscopy (colonoscopy or flexible sigmoidoscopy):  Excessive amounts of blood in the stool  Significant tenderness or worsening of abdominal pains  Swelling of the abdomen that is new, acute  Fever of 100F or higher  For urgent or emergent issues, a gastroenterologist can be reached at any hour by calling [phone].   DIET:  We do recommend a small meal at first, but then you may proceed to your regular diet.  Drink plenty of fluids but you should avoid alcoholic beverages for 24 hours.  ACTIVITY:  You should plan to take it easy for the rest of today and you should NOT DRIVE or use heavy machinery until tomorrow (because of the sedation medicines used during the test).     FOLLOW UP: Our staff will call the number listed on your records the next business day following your procedure to check on you and address any questions or concerns that you may have regarding the information given to you following your procedure. If we do not reach you, we will leave a message.  However, if you are feeling well and you are not experiencing any problems, there is no need to return our call.  We will assume that you have returned to your regular daily activities without incident.  If any biopsies were taken you will be contacted by phone or by letter within the next 1-3 weeks.  Please call us at [phone] if you have not heard about the biopsies in 3 weeks.   Await for biopsy results to determine next repeat Colonoscopy screening Polyps (handout given) Hemorrhoids (handout given)   SIGNATURES/CONFIDENTIALITY: You and/or your care partner have signed paperwork which will be entered into your electronic medical record.  These signatures attest to the fact that that the information above on your After Visit Summary has been reviewed and is understood.  Full responsibility of the confidentiality of this discharge information lies with you and/or your care-partner. 

## 2017-03-21 NOTE — Op Note (Signed)
[employer] Patient Name: Corey Nguyen Procedure Date: 03/21/2017 9:04 AM MRN: [id number] Endoscopist: Mauri Pole , MD Age: 51 Referring MD:  Date of Birth: 1966/01/13 Gender: Male Account #: [id number] Procedure:                Colonoscopy Indications:              Screening for colorectal malignant neoplasm, This                            is the patient's first colonoscopy Medicines:                Monitored Anesthesia Care Procedure:                Pre-Anesthesia Assessment:                           - Prior to the procedure, a History and Physical                            was performed, and patient medications and                            allergies were reviewed. The patient's tolerance of                            previous anesthesia was also reviewed. The risks                            and benefits of the procedure and the sedation                            options and risks were discussed with the patient.                            All questions were answered, and informed consent                            was obtained. Prior Anticoagulants: The patient has                            taken no previous anticoagulant or antiplatelet                            agents. ASA Grade Assessment: II - A patient with                            mild systemic disease. After reviewing the risks                            and benefits, the patient was deemed in                            satisfactory condition to undergo the procedure.  After obtaining informed consent, the colonoscope                            was passed under direct vision. Throughout the                            procedure, the patient's blood pressure, pulse, and                            oxygen saturations were monitored continuously. The                            Colonoscope was introduced through the anus and                            advanced to the the  terminal ileum, with                            identification of the appendiceal orifice and IC                            valve. The colonoscopy was performed without                            difficulty. The patient tolerated the procedure                            well. The quality of the bowel preparation was                            excellent. The terminal ileum, ileocecal valve,                            appendiceal orifice, and rectum were photographed. Scope In: 9:13:22 AM Scope Out: 9:24:28 AM Scope Withdrawal Time: 0 hours 8 minutes 12 seconds  Total Procedure Duration: 0 hours 11 minutes 6 seconds  Findings:                 The perianal and digital rectal examinations were                            normal.                           A 5 mm polyp was found in the cecum. The polyp was                            sessile. The polyp was removed with a cold snare.                            Resection and retrieval were complete.                           A 1 mm polyp was found in the sigmoid colon. The  polyp was sessile. The polyp was removed with a                            cold biopsy forceps. Resection and retrieval were                            complete.                           A scattered area of moderate melanosis was found in                            the entire colon.                           Non-bleeding internal hemorrhoids were found during                            retroflexion. The hemorrhoids were small.                           The exam was otherwise without abnormality. Complications:            No immediate complications. Estimated Blood Loss:     Estimated blood loss was minimal. Impression:               - One 5 mm polyp in the cecum, removed with a cold                            snare. Resected and retrieved.                           - One 1 mm polyp in the sigmoid colon, removed with                            a cold  biopsy forceps. Resected and retrieved.                           - Melanosis in the colon.                           - Non-bleeding internal hemorrhoids.                           - The examination was otherwise normal. Recommendation:           - Patient has a contact number available for                            emergencies. The signs and symptoms of potential                            delayed complications were discussed with the                            patient. Return to normal activities  tomorrow.                            Written discharge instructions were provided to the                            patient.                           - Resume previous diet.                           - Continue present medications.                           - Await pathology results.                           - Repeat colonoscopy in 5-10 years for surveillance                            based on pathology results. Mauri Pole, MD 03/21/2017 9:28:29 AM This report has been signed electronically.

## 2017-03-22 ENCOUNTER — Telehealth: Payer: Self-pay | Admitting: *Deleted

## 2017-03-22 NOTE — Telephone Encounter (Signed)
  Follow up Call-  Call back number 03/21/2017  Post procedure Call Back phone  # [phone]  Permission to leave phone message Yes  Some recent data might be hidden     Patient questions:  Do you have a fever, pain , or abdominal swelling? No. Pain Score  0 *  Have you tolerated food without any problems? Yes.    Have you been able to return to your normal activities? Yes.    Do you have any questions about your discharge instructions: Diet   No. Medications  No. Follow up visit  No.  Do you have questions or concerns about your Care? No.  Actions: * If pain score is 4 or above: No action needed, pain <4.

## 2017-03-25 ENCOUNTER — Encounter: Payer: Self-pay | Admitting: Gastroenterology

## 2017-04-01 ENCOUNTER — Other Ambulatory Visit (INDEPENDENT_AMBULATORY_CARE_PROVIDER_SITE_OTHER): Payer: BLUE CROSS/BLUE SHIELD

## 2017-04-01 DIAGNOSIS — E781 Pure hyperglyceridemia: Secondary | ICD-10-CM

## 2017-04-01 LAB — LIPID PANEL
CHOL/HDL RATIO: 4
CHOLESTEROL: 168 mg/dL (ref 0–200)
HDL: 37.9 mg/dL — AB (ref >39.00)
NonHDL: 129.96
Triglycerides: 247 mg/dL — ABNORMAL HIGH (ref 0.0–149.0)
VLDL: 49.4 mg/dL — AB (ref 0.0–40.0)

## 2017-04-01 LAB — LDL CHOLESTEROL, DIRECT: Direct LDL: 102 mg/dL

## 2017-04-04 ENCOUNTER — Other Ambulatory Visit: Payer: BLUE CROSS/BLUE SHIELD

## 2017-04-07 ENCOUNTER — Encounter: Payer: Self-pay | Admitting: Family Medicine

## 2017-04-07 DIAGNOSIS — I10 Essential (primary) hypertension: Secondary | ICD-10-CM

## 2017-04-07 MED ORDER — LISINOPRIL-HYDROCHLOROTHIAZIDE 10-12.5 MG PO TABS: 1.0000 | ORAL_TABLET | Freq: Every day | ORAL | 0 refills | Status: DC

## 2017-04-11 ENCOUNTER — Ambulatory Visit (INDEPENDENT_AMBULATORY_CARE_PROVIDER_SITE_OTHER): Payer: BLUE CROSS/BLUE SHIELD | Admitting: Family Medicine

## 2017-04-11 ENCOUNTER — Encounter: Payer: Self-pay | Admitting: Family Medicine

## 2017-04-11 VITALS — BP 120/80 | HR 68 | Temp 98.2°F | Ht 74.0 in | Wt 271.8 lb

## 2017-04-11 DIAGNOSIS — N481 Balanitis: Secondary | ICD-10-CM | POA: Diagnosis not present

## 2017-04-11 DIAGNOSIS — E781 Pure hyperglyceridemia: Secondary | ICD-10-CM

## 2017-04-11 DIAGNOSIS — I10 Essential (primary) hypertension: Secondary | ICD-10-CM

## 2017-04-11 MED ORDER — ATORVASTATIN CALCIUM 40 MG PO TABS: 40.0000 mg | ORAL_TABLET | Freq: Every day | ORAL | 1 refills | Status: DC

## 2017-04-11 MED ORDER — CLOTRIMAZOLE 1 % EX CREA: 1.0000 "application " | TOPICAL_CREAM | Freq: Two times a day (BID) | CUTANEOUS | 0 refills | Status: AC

## 2017-04-11 NOTE — Patient Instructions (Addendum)
Baby powder to help keep things clean and dry in your private area. The cream is also available over the counter so get whichever is cheaper.  Stay very well hydrated with water. If you have any issues with your medicine, let us know.

## 2017-04-11 NOTE — Progress Notes (Signed)
Chief Complaint  Patient presents with  . Follow-up    3 mos on cholesterol    Subjective: Dyslipidemia Patient presents for dyslipidemia follow up. He is not on any treatment for this.  He is adhering to a low sodium and low fat diet. The patient exercises walking.  The patient is not known to have coexisting coronary artery disease.  HTN Pt's BP has improved since restarting Prinzide 10-12.5 mg daily. He is tolerating well.  Denies CP or SOB.  Jock itch Pt has hx of jock itch that he self tx's with A&D. This time, he required Tinactin that was helpful, but things returned after he stopped using it. It eventually came back and started to affect the head of his penis. No discharge or new sexual partners.   ROS: Heart: Denies chest pain Lungs: Denies SOB   Family History  Problem Relation Age of Onset  . Hypercholesterolemia Father        Living  . Hypercholesterolemia Brother   . Healthy Mother        Living  . Diabetes Maternal Grandfather        Insulin  . Heart defect Maternal Grandmother   . Alzheimer's disease Maternal Grandmother   . Prostate cancer Paternal Grandfather   . Healthy Paternal Grandmother        Living  . Healthy Paternal Uncle   . Diabetes Maternal Aunt   . Healthy Maternal Aunt   . Migraines Daughter   . Healthy Other        Siblings   Past Medical History:  Diagnosis Date  . Chicken pox    childhood  . Hyperlipidemia    borderline  . Hypertension   . Idiopathic gout of knee   . Measles    childhood  . Mumps    childhood  . Sleep apnea    CPAP   No Known Allergies  Current Outpatient Prescriptions:  .  lisinopril-hydrochlorothiazide (PRINZIDE,ZESTORETIC) 10-12.5 MG tablet, Take 1 tablet by mouth daily., Disp: 90 tablet, Rfl: 0 .  Multiple Vitamin (MULTI VITAMIN MENS PO), Take 1 tablet by mouth daily., Disp: , Rfl:  .  naproxen (NAPROSYN) 500 MG tablet, When you have a flare, take 2 tabs daily for 5 days., Disp: 30 tablet, Rfl:  2 .  Omega-3 Fatty Acids (FISH OIL) 1000 MG CAPS, Take 1 capsule by mouth daily., Disp: , Rfl:  .  Wheat Dextrin (BENEFIBER DRINK MIX PO), Take by mouth. Take as directed., Disp: , Rfl:   Objective: BP 120/80 (BP Location: Left Arm, Patient Position: Sitting, Cuff Size: Large)   Pulse 68   Temp 98.2 F (36.8 C) (Oral)   Ht 6\' 2"  (1.88 m)   Wt 271 lb 12.8 oz (123.3 kg)   SpO2 98%   BMI 34.90 kg/m  General: Awake, appears stated age HEENT: MMM, EOMi Heart: RRR, no murmurs, no bruits, no LE edema Lungs: CTAB, no rales, wheezes or rhonchi. No accessory muscle use GU: Mild erythematous patch on the L base of glans, no drainage, vesicles, or adhesions Psych: Age appropriate judgment and insight, normal affect and mood  Assessment and Plan: Hypertriglyceridemia - Plan: atorvastatin (LIPITOR) 40 MG tablet, DISCONTINUED: atorvastatin (LIPITOR) 40 MG tablet  Balanitis - Plan: clotrimazole (LOTRIMIN) 1 % cream  Orders as above. Start statin for TG's. Stay hydrated. BP well controlled today. Cont Prinzide 10-12.5 mg daily. Check Home BP prn. Clotrimazole for balanitis. Hygiene discussed. Rec'd baby powder to keep things dry. F/u in 3  mo. Call if not tolerating medicine well. Will discuss intermittent fasting at that time.  The patient voiced understanding and agreement to the plan.  [place], DO 04/11/17  7:45 AM

## 2017-06-30 ENCOUNTER — Other Ambulatory Visit: Payer: Self-pay | Admitting: Family Medicine

## 2017-06-30 DIAGNOSIS — I10 Essential (primary) hypertension: Secondary | ICD-10-CM

## 2017-07-13 ENCOUNTER — Ambulatory Visit (INDEPENDENT_AMBULATORY_CARE_PROVIDER_SITE_OTHER): Payer: BLUE CROSS/BLUE SHIELD | Admitting: Family Medicine

## 2017-07-13 ENCOUNTER — Encounter: Payer: Self-pay | Admitting: Family Medicine

## 2017-07-13 VITALS — BP 114/82 | HR 62 | Temp 98.2°F | Ht 74.5 in | Wt 282.0 lb

## 2017-07-13 DIAGNOSIS — M1A9XX0 Chronic gout, unspecified, without tophus (tophi): Secondary | ICD-10-CM | POA: Diagnosis not present

## 2017-07-13 DIAGNOSIS — I10 Essential (primary) hypertension: Secondary | ICD-10-CM | POA: Diagnosis not present

## 2017-07-13 DIAGNOSIS — M109 Gout, unspecified: Secondary | ICD-10-CM | POA: Insufficient documentation

## 2017-07-13 DIAGNOSIS — E781 Pure hyperglyceridemia: Secondary | ICD-10-CM

## 2017-07-13 HISTORY — DX: Pure hyperglyceridemia: E78.1

## 2017-07-13 LAB — LIPID PANEL
CHOL/HDL RATIO: 3
CHOLESTEROL: 129 mg/dL (ref 0–200)
HDL: 43.6 mg/dL (ref >39.00)
LDL CALC: 49 mg/dL (ref 0–99)
NonHDL: 85.83
Triglycerides: 183 mg/dL — ABNORMAL HIGH (ref 0.0–149.0)
VLDL: 36.6 mg/dL (ref 0.0–40.0)

## 2017-07-13 LAB — COMPREHENSIVE METABOLIC PANEL
ALBUMIN: 4.4 g/dL (ref 3.5–5.2)
ALT: 32 U/L (ref 0–53)
AST: 21 U/L (ref 0–37)
Alkaline Phosphatase: 61 U/L (ref 39–117)
BUN: 15 mg/dL (ref 6–23)
CHLORIDE: 104 meq/L (ref 96–112)
CO2: 26 mEq/L (ref 19–32)
Calcium: 9.3 mg/dL (ref 8.4–10.5)
Creatinine, Ser: 0.84 mg/dL (ref 0.40–1.50)
GFR: 101.55 mL/min (ref >60.00)
Glucose, Bld: 98 mg/dL (ref 70–99)
Potassium: 4 mEq/L (ref 3.5–5.1)
Sodium: 138 mEq/L (ref 135–145)
Total Bilirubin: 0.7 mg/dL (ref 0.2–1.2)
Total Protein: 7.2 g/dL (ref 6.0–8.3)

## 2017-07-13 LAB — URIC ACID: Uric Acid, Serum: 7.2 mg/dL (ref 4.0–7.8)

## 2017-07-13 NOTE — Progress Notes (Signed)
Pre visit review using our clinic review tool, if applicable. No additional management support is needed unless otherwise documented below in the visit note. 

## 2017-07-13 NOTE — Progress Notes (Signed)
Chief Complaint  Patient presents with  . Follow-up    Subjective: Dyslipidemia Patient presents for hypertriglyceridemia follow up. Compliance with treatment thus far has been good- Lipitor 40 mg daily. He denies myalgias. He is sometimes adhering to a healthy diet, admits it could be better. The patient exercises - some walking.  The patient is not known to have coexisting coronary artery disease.   Gout Hx of non-tophaceous gout affecting R MTP when he has flares. Has 4-5 flares per year, uric acid in 01/2017 was 6.2, offered prophylactic medicine, but pt declined.  Hypertension Patient presents for hypertension follow up. He does not monitor home blood pressures. He is compliant with medications- Prinzide 10-12.5 mg daily. Patient has these side effects of medication: none He is sometimes  adhering to a healthy diet overall. Exercise: walking   ROS: Heart: Denies chest pain or palpitations Lungs: Denies SOB or cough  Family History  Problem Relation Age of Onset  . Hypercholesterolemia Father        Living  . Hypercholesterolemia Brother   . Healthy Mother        Living  . Diabetes Maternal Grandfather        Insulin  . Heart defect Maternal Grandmother   . Alzheimer's disease Maternal Grandmother   . Prostate cancer Paternal Grandfather   . Healthy Paternal Grandmother        Living  . Healthy Paternal Uncle   . Diabetes Maternal Aunt   . Healthy Maternal Aunt   . Migraines Daughter   . Healthy Other        Siblings   Past Medical History:  Diagnosis Date  . Chicken pox    childhood  . Hyperlipidemia    borderline  . Hypertension   . Hypertriglyceridemia 07/13/2017  . Idiopathic gout of knee   . Measles    childhood  . Mumps    childhood  . Sleep apnea    CPAP   No Known Allergies  Current Outpatient Prescriptions:  .  atorvastatin (LIPITOR) 40 MG tablet, Take 1 tablet (40 mg total) by mouth daily., Disp: 90 tablet, Rfl: 1 .   lisinopril-hydrochlorothiazide (PRINZIDE,ZESTORETIC) 10-12.5 MG tablet, TAKE 1 TABLET DAILY, Disp: 90 tablet, Rfl: 0 .  Multiple Vitamin (MULTI VITAMIN MENS PO), Take 1 tablet by mouth daily., Disp: , Rfl:  .  naproxen (NAPROSYN) 500 MG tablet, When you have a flare, take 2 tabs daily for 5 days., Disp: 30 tablet, Rfl: 2 .  Omega-3 Fatty Acids (FISH OIL) 1000 MG CAPS, Take 1 capsule by mouth daily., Disp: , Rfl:  .  Wheat Dextrin (BENEFIBER DRINK MIX PO), Take by mouth. Take as directed., Disp: , Rfl:  Objective: BP 114/82 (BP Location: Left Arm, Patient Position: Sitting, Cuff Size: Large)   Pulse 62   Temp 98.2 F (36.8 C) (Oral)   Ht 6' 2.5" (1.892 m)   Wt 282 lb (127.9 kg)   SpO2 97%   BMI 35.72 kg/m  General: Awake, appears stated age HEENT: MMM, EOMi Heart: RRR, no murmurs, no bruits Lungs: CTAB, no rales, wheezes or rhonchi. No accessory muscle use Psych: Age appropriate judgment and insight, normal affect and mood  Assessment and Plan: Hypertriglyceridemia - Plan: Comprehensive metabolic panel, Lipid panel  Chronic gout involving toe of right foot without tophus, unspecified cause - Plan: Uric acid  Essential hypertension, benign  Orders as above. Cont lipitor.  Counseled on diet and exercise.  Discussed prophlyactic meds again, still wishes to continue  as is.  Cont Prinzide 10-12.5 mg daily. OK to check home BP's prn.  F/u in 6 mo pending above or prn.  The patient voiced understanding and agreement to the plan.  [place], DO 07/13/17  7:49 AM

## 2017-07-13 NOTE — Addendum Note (Signed)
Addended by: Ames Coupe on: 07/13/2017 07:51 AM   Modules accepted: Level of Service

## 2017-07-13 NOTE — Patient Instructions (Signed)
Keep up your efforts with your dieting and weight loss.   Let us know if you need anything.

## 2017-07-28 DIAGNOSIS — G4733 Obstructive sleep apnea (adult) (pediatric): Secondary | ICD-10-CM | POA: Diagnosis not present

## 2017-08-27 ENCOUNTER — Other Ambulatory Visit: Payer: Self-pay

## 2017-08-27 ENCOUNTER — Encounter (HOSPITAL_BASED_OUTPATIENT_CLINIC_OR_DEPARTMENT_OTHER): Payer: Self-pay | Admitting: Emergency Medicine

## 2017-08-27 ENCOUNTER — Emergency Department (HOSPITAL_BASED_OUTPATIENT_CLINIC_OR_DEPARTMENT_OTHER)
Admission: EM | Admit: 2017-08-27 | Discharge: 2017-08-27 | Disposition: A | Discharge status: Home / Self Care | Payer: BLUE CROSS/BLUE SHIELD | Attending: Emergency Medicine | Admitting: Emergency Medicine

## 2017-08-27 DIAGNOSIS — E785 Hyperlipidemia, unspecified: Secondary | ICD-10-CM | POA: Insufficient documentation

## 2017-08-27 DIAGNOSIS — Z79899 Other long term (current) drug therapy: Secondary | ICD-10-CM | POA: Insufficient documentation

## 2017-08-27 DIAGNOSIS — I10 Essential (primary) hypertension: Secondary | ICD-10-CM | POA: Diagnosis not present

## 2017-08-27 DIAGNOSIS — M545 Low back pain, unspecified: Secondary | ICD-10-CM

## 2017-08-27 DIAGNOSIS — R350 Frequency of micturition: Secondary | ICD-10-CM | POA: Insufficient documentation

## 2017-08-27 LAB — URINALYSIS, MICROSCOPIC (REFLEX): Squamous Epithelial / LPF: NONE SEEN

## 2017-08-27 LAB — URINALYSIS, ROUTINE W REFLEX MICROSCOPIC
BILIRUBIN URINE: NEGATIVE
GLUCOSE, UA: NEGATIVE mg/dL
KETONES UR: NEGATIVE mg/dL
Leukocytes, UA: NEGATIVE
NITRITE: NEGATIVE
PH: 6 (ref 5.0–8.0)
Protein, ur: NEGATIVE mg/dL
SPECIFIC GRAVITY, URINE: 1.01 (ref 1.005–1.030)

## 2017-08-27 LAB — CBG MONITORING, ED: Glucose-Capillary: 81 mg/dL (ref 65–99)

## 2017-08-27 NOTE — ED Triage Notes (Signed)
Patient states that his lower back on both sides hurts. He also reports that he has had some discoloration and odor to his urine recently

## 2017-08-27 NOTE — ED Provider Notes (Signed)
[facility] Provider Note   CSN: [id number] Arrival date & time: 08/27/17  1354     History   Chief Complaint Chief Complaint  Patient presents with  . Back Pain    HPI Corey Nguyen is a 51 y.o. male.  HPI   Reports throbbing of bilateral lower back. Urine smell present since Monday, low back pain prominent this morning.  Reports feels different than other back pain has had. Feels like a throbbing discomfort of low back. Worse this morning.  Tried stretching.  Felt best when curled up into a ball.  Doesn't change with movements.  Feels like urine has abnormal odor.  No dysuria.  Over the last few days has felt like he has had urinary frequency.  Was out of town for work Sunday- Wednesday, noticed that he could not hold urine as long as he normally does.  No difficulty urinating.  No abdominal pain.  No nausea or vomiting. No known fevers, but has felt moments of subjective fever.  Diarrhea on Wednesday and Thursday.  No numbness or weakness, no loss of control bowel or bladder, no IV drugs, no chronic steroids, no trauma  Past Medical History:  Diagnosis Date  . Chicken pox    childhood  . Hyperlipidemia    borderline  . Hypertension   . Hypertriglyceridemia 07/13/2017  . Idiopathic gout of knee   . Measles    childhood  . Mumps    childhood  . Sleep apnea    CPAP    Patient Active Problem List   Diagnosis Date Noted  . Hypertriglyceridemia 07/13/2017  . Gout 07/13/2017  . Obesity (BMI 30-39.9) 09/08/2015  . Visit for preventive health examination 09/08/2015  . Colon cancer screening 09/23/2014  . Uvular hypertrophy 04/10/2014  . Snoring 04/10/2014  . Essential hypertension, benign 05/18/2013    Past Surgical History:  Procedure Laterality Date  . TESTICLE REMOVAL     Age 57-Undescended       Home Medications    Prior to Admission medications   Medication Sig Start Date End Date Taking? Authorizing Provider    atorvastatin (LIPITOR) 40 MG tablet Take 1 tablet (40 mg total) by mouth daily. 04/11/17   Shelda Pal, DO  lisinopril-hydrochlorothiazide (PRINZIDE,ZESTORETIC) 10-12.5 MG tablet TAKE 1 TABLET DAILY 06/30/17   Shelda Pal, DO  Multiple Vitamin (MULTI VITAMIN MENS PO) Take 1 tablet by mouth daily.    [provider]  naproxen (NAPROSYN) 500 MG tablet When you have a flare, take 2 tabs daily for 5 days. 01/06/17   Shelda Pal, DO  Omega-3 Fatty Acids (FISH OIL) 1000 MG CAPS Take 1 capsule by mouth daily.    [provider]  Wheat Dextrin (BENEFIBER DRINK MIX PO) Take by mouth. Take as directed.    [provider]    Family History Family History  Problem Relation Age of Onset  . Hypercholesterolemia Father        Living  . Hypercholesterolemia Brother   . Healthy Mother        Living  . Diabetes Maternal Grandfather        Insulin  . Heart defect Maternal Grandmother   . Alzheimer's disease Maternal Grandmother   . Prostate cancer Paternal Grandfather   . Healthy Paternal Grandmother        Living  . Healthy Paternal Uncle   . Diabetes Maternal Aunt   . Healthy Maternal Aunt   . Migraines Daughter   .  Healthy Other        Siblings    Social History Social History   Tobacco Use  . Smoking status: Never Smoker  . Smokeless tobacco: Never Used  Substance Use Topics  . Alcohol use: Yes    Alcohol/week: 14.4 oz    Types: 24 Cans of beer per week  . Drug use: No     Allergies   Patient has no known allergies.   Review of Systems Review of Systems  Constitutional: Negative for fever.  HENT: Negative for sore throat.   Eyes: Negative for visual disturbance.  Respiratory: Negative for shortness of breath.   Cardiovascular: Negative for chest pain.  Gastrointestinal: Positive for diarrhea. Negative for abdominal pain, constipation, nausea and vomiting.  Genitourinary: Positive for frequency. Negative for  difficulty urinating.  Musculoskeletal: Positive for back pain. Negative for neck stiffness.  Skin: Negative for rash.  Neurological: Negative for syncope and headaches.     Physical Exam Updated Vital Signs BP 134/84 (BP Location: Right Arm)   Pulse 68   Temp 98.4 F (36.9 C) (Oral)   Resp 18   Ht 6\' 2"  (1.88 m)   Wt 120.2 kg (265 lb)   SpO2 100%   BMI 34.02 kg/m   Physical Exam  Constitutional: He is oriented to person, place, and time. He appears well-developed and well-nourished. No distress.  HENT:  Head: Normocephalic and atraumatic.  Eyes: Conjunctivae and EOM are normal.  Neck: Normal range of motion.  Cardiovascular: Normal rate, regular rhythm, normal heart sounds and intact distal pulses. Exam reveals no gallop and no friction rub.  No murmur heard. Pulmonary/Chest: Effort normal and breath sounds normal. No respiratory distress. He has no wheezes. He has no rales.  Abdominal: Soft. He exhibits no distension. There is no tenderness. There is no guarding.  Genitourinary: Prostate normal. Prostate is not tender.  Musculoskeletal: He exhibits no edema.  No CVA tenderness, no midline or paraspinal muscle tenderness   Neurological: He is alert and oriented to person, place, and time. GCS eye subscore is 4. GCS verbal subscore is 5. GCS motor subscore is 6.  Normal strength and sensation lower ext  Skin: Skin is warm and dry. He is not diaphoretic.  Nursing note and vitals reviewed.    ED Treatments / Results  Labs (all labs ordered are listed, but only abnormal results are displayed) Labs Reviewed  URINALYSIS, ROUTINE W REFLEX MICROSCOPIC - Abnormal; Notable for the following components:      Result Value   Hgb urine dipstick MODERATE (*)    All other components within normal limits  URINALYSIS, MICROSCOPIC (REFLEX) - Abnormal; Notable for the following components:   Bacteria, UA FEW (*)    All other components within normal limits  URINE CULTURE  CBG  MONITORING, ED    EKG  EKG Interpretation None       Radiology No results found.  Procedures Procedures (including critical care time)  Medications Ordered in ED Medications - No data to display   Initial Impression / Assessment and Plan / ED Course  I have reviewed the triage vital signs and the nursing notes.  Pertinent labs & imaging results that were available during my care of the patient were reviewed by me and considered in my medical decision making (see chart for details).     51 year old male with a history of hypertension hyperlipidemia presents with concern for bilateral throbbing lower back pain and urinary odor.  Urinalysis completed which does not  show signs of urinary tract infection, however given symptoms will send for culture.  No prostate tenderness to suggest prostatitis.  History not classic for nephrolithiasis.  Noted to have hgb but few RBC on microscopic, however does not have history that sounds consistent with rhabdomyolysis.  Discussed with patient that it is reasonable to obtain CT and lab work to evaluate renal function and rule out nephrolithiasis or other abnormalities, however it is also reasonable to continue to monitor symptoms at this time, follow up urine culture and have him see PCP early this week.  Patient would like more conservative approach at this time which is reasonable as I doubt acute abscess, retroperitoneal bleed, pyelonephritis, rhabdomyolysis, AAA.  No other red flags of back pain.  Discussed reasons to return in detail. Urine yellow in color, low post void residual. Glucose WNL.  Patient discharged in stable condition with understanding of reasons to return.   Final Clinical Impressions(s) / ED Diagnoses   Final diagnoses:  Urinary frequency  Acute bilateral low back pain without sciatica    ED Discharge Orders    None       Gareth Morgan, MD 08/27/17 1656

## 2017-08-27 NOTE — ED Notes (Signed)
ED Provider at bedside. 

## 2017-08-29 LAB — URINE CULTURE

## 2017-09-16 ENCOUNTER — Other Ambulatory Visit: Payer: Self-pay | Admitting: Family Medicine

## 2017-09-16 DIAGNOSIS — I10 Essential (primary) hypertension: Secondary | ICD-10-CM

## 2018-01-09 ENCOUNTER — Ambulatory Visit: Payer: BLUE CROSS/BLUE SHIELD | Admitting: Family Medicine

## 2018-01-13 ENCOUNTER — Encounter: Payer: Self-pay | Admitting: Family Medicine

## 2018-01-13 ENCOUNTER — Ambulatory Visit (INDEPENDENT_AMBULATORY_CARE_PROVIDER_SITE_OTHER): Payer: BLUE CROSS/BLUE SHIELD | Admitting: Family Medicine

## 2018-01-13 ENCOUNTER — Other Ambulatory Visit: Payer: Self-pay | Admitting: Family Medicine

## 2018-01-13 VITALS — BP 122/80 | HR 70 | Temp 97.5°F | Ht 75.0 in | Wt 270.5 lb

## 2018-01-13 DIAGNOSIS — I10 Essential (primary) hypertension: Secondary | ICD-10-CM | POA: Diagnosis not present

## 2018-01-13 DIAGNOSIS — E781 Pure hyperglyceridemia: Secondary | ICD-10-CM

## 2018-01-13 DIAGNOSIS — M1A9XX0 Chronic gout, unspecified, without tophus (tophi): Secondary | ICD-10-CM | POA: Diagnosis not present

## 2018-01-13 LAB — LIPID PANEL
CHOL/HDL RATIO: 3
Cholesterol: 134 mg/dL (ref 0–200)
HDL: 41.7 mg/dL (ref >39.00)
NONHDL: 92.42
Triglycerides: 213 mg/dL — ABNORMAL HIGH (ref 0.0–149.0)
VLDL: 42.6 mg/dL — AB (ref 0.0–40.0)

## 2018-01-13 LAB — URIC ACID: URIC ACID, SERUM: 8.9 mg/dL — AB (ref 4.0–7.8)

## 2018-01-13 LAB — COMPREHENSIVE METABOLIC PANEL
ALT: 32 U/L (ref 0–53)
AST: 27 U/L (ref 0–37)
Albumin: 4.5 g/dL (ref 3.5–5.2)
Alkaline Phosphatase: 64 U/L (ref 39–117)
BUN: 20 mg/dL (ref 6–23)
CHLORIDE: 100 meq/L (ref 96–112)
CO2: 28 meq/L (ref 19–32)
Calcium: 9.6 mg/dL (ref 8.4–10.5)
Creatinine, Ser: 1.04 mg/dL (ref 0.40–1.50)
GFR: 79.21 mL/min (ref >60.00)
GLUCOSE: 96 mg/dL (ref 70–99)
POTASSIUM: 3.9 meq/L (ref 3.5–5.1)
SODIUM: 138 meq/L (ref 135–145)
TOTAL PROTEIN: 7.6 g/dL (ref 6.0–8.3)
Total Bilirubin: 0.9 mg/dL (ref 0.2–1.2)

## 2018-01-13 LAB — LDL CHOLESTEROL, DIRECT: Direct LDL: 72 mg/dL

## 2018-01-13 MED ORDER — LISINOPRIL-HYDROCHLOROTHIAZIDE 10-12.5 MG PO TABS: 1.0000 | ORAL_TABLET | Freq: Every day | ORAL | 3 refills | Status: DC

## 2018-01-13 MED ORDER — ATORVASTATIN CALCIUM 40 MG PO TABS: 40.0000 mg | ORAL_TABLET | Freq: Every day | ORAL | 3 refills | Status: DC

## 2018-01-13 NOTE — Progress Notes (Signed)
Pre visit review using our clinic review tool, if applicable. No additional management support is needed unless otherwise documented below in the visit note. 

## 2018-01-13 NOTE — Patient Instructions (Addendum)
Keep up the good work!  Sleep Hygiene Tips:  Do not watch TV or look at screens within 1 hour of going to bed. If you do, make sure there is a blue light filter (nighttime mode) involved.  Try to go to bed around the same time every night. Wake up at the same time within 1 hour of regular time. Ex: If you wake up at 7 AM for work, do not sleep past 8 AM on days that you don't work.  Do not drink alcohol before bedtime.  Do not consume caffeine-containing beverages after noon or within 9 hours of intended bedtime.  Get regular exercise/physical activity in your life, but not within 2 hours of planned bedtime.  Do not take naps.   Do not eat within 2 hours of planned bedtime.  Melatonin, 3-5 mg 30-60 minutes before planned bedtime may be helpful.   The bed should be for sleep or sex only. If after 20-30 minutes you are unable to fall asleep, get up and do something relaxing. Do this until you feel ready to go to sleep again.   To help with stress: Choose 5 that work for you:  Take a deep breath  Count to 20  Read a book  Do a puzzle  Meditate  Bake  [place] outside  Call a friend  Listen to music  Take a walk  Color  Send a note  Take a bath  Watch a movie  Be alone in a quiet place  Pet an animal  Visit a friend  Journal  Exercise  Stretch    Let us know if you need anything.

## 2018-01-13 NOTE — Progress Notes (Signed)
Chief Complaint  Patient presents with  . Follow-up    6 month    Subjective: Patient presents for Hypertriglyceridemia follow up. Currently taking Lipitor 40 mg/d and compliance with treatment thus far has been good. He denies myalgias. He is adhering to a healthy diet. The patient exercises with walking on a daily basis now.  The patient is not known to have coexisting coronary artery disease.  Hypertension Patient presents for hypertension follow up. He does not monitor home blood pressures. He is compliant with medications. Patient has these side effects of medication: none He is adhering to a healthy diet overall. Exercise: walking  Gout Hx of chronic nontoph gout of R great MTP. Has had 1-2 flares that was managed successfully with naproxen. His last urate was 7.2, he did not wish to start a daily medication at that time. He does not notice any foods flare his symptoms. No current issues.   ROS: Heart: Denies chest pain Lungs: Denies SOB  Past Medical History:  Diagnosis Date  . Chicken pox    childhood  . Hyperlipidemia    borderline  . Hypertension   . Hypertriglyceridemia 07/13/2017  . Idiopathic gout of knee   . Measles    childhood  . Mumps    childhood  . Sleep apnea    CPAP   Past Surgical History:  Procedure Laterality Date  . TESTICLE REMOVAL     Age 87-Undescended   Allergies as of 01/13/2018   No Known Allergies     Medication List        Accurate as of 01/13/18  8:05 AM. Always use your most recent med list.          atorvastatin 40 MG tablet Commonly known as:  LIPITOR Take 1 tablet (40 mg total) by mouth daily.   BENEFIBER DRINK MIX PO Take by mouth. Take as directed.   Fish Oil 1000 MG Caps Take 1 capsule by mouth daily.   lisinopril-hydrochlorothiazide 10-12.5 MG tablet Commonly known as:  PRINZIDE,ZESTORETIC Take 1 tablet by mouth daily.   MULTI VITAMIN MENS PO Take 1 tablet by mouth daily.   naproxen 500 MG  tablet Commonly known as:  NAPROSYN When you have a flare, take 2 tabs daily for 5 days.      Objective: BP 122/80 (BP Location: Left Arm, Patient Position: Sitting, Cuff Size: Large)   Pulse 70   Temp (!) 97.5 F (36.4 C) (Oral)   Ht 6\' 3"  (1.905 m)   Wt 270 lb 8 oz (122.7 kg)   SpO2 99%   BMI 33.81 kg/m  General: Awake, appears stated age HEENT: MMM, EOMi Heart: RRR, no LE edema, no bruits Lungs: CTAB, no rales, wheezes or rhonchi. No accessory muscle use Psych: Age appropriate judgment and insight, normal affect and mood  Assessment and Plan: Essential hypertension, benign - Plan: lisinopril-hydrochlorothiazide (PRINZIDE,ZESTORETIC) 10-12.5 MG tablet, Comprehensive metabolic panel  Hypertriglyceridemia - Plan: atorvastatin (LIPITOR) 40 MG tablet, Lipid panel  Chronic gout involving toe of right foot without tophus, unspecified cause - Plan: Uric acid  Orders as above.  Cont Prinzide. Cont Lipitor, if elevated could increase dose vs start fibrate.  Would prefer to cont current management for now. If it increased again and he is having more flares, he will consider prophylactic therapy. His wife recently passed away and he is grieving. I offered medical help but he declined. Stress management options provided. Sleep hygiene instructions also provided.  F/u in 6 mo for CPE. The  patient voiced understanding and agreement to the plan.  [place], DO 01/13/18  8:05 AM

## 2018-02-02 DIAGNOSIS — G4733 Obstructive sleep apnea (adult) (pediatric): Secondary | ICD-10-CM | POA: Diagnosis not present

## 2018-02-07 ENCOUNTER — Encounter: Payer: Self-pay | Admitting: Family Medicine

## 2018-02-07 MED ORDER — ATORVASTATIN CALCIUM 80 MG PO TABS: 80.0000 mg | ORAL_TABLET | Freq: Every day | ORAL | 3 refills | Status: DC

## 2018-02-07 NOTE — Addendum Note (Signed)
Addended by: Ames Coupe on: 02/07/2018 07:35 PM   Modules accepted: Orders

## 2018-03-02 ENCOUNTER — Other Ambulatory Visit: Payer: Self-pay | Admitting: Family Medicine

## 2018-03-02 ENCOUNTER — Telehealth: Payer: Self-pay | Admitting: Family Medicine

## 2018-03-02 DIAGNOSIS — M1000 Idiopathic gout, unspecified site: Secondary | ICD-10-CM

## 2018-03-02 MED ORDER — ALLOPURINOL 100 MG PO TABS: 100.0000 mg | ORAL_TABLET | Freq: Every day | ORAL | 6 refills | Status: DC

## 2018-03-02 NOTE — Telephone Encounter (Signed)
Do not start this until flare resolves. We should recheck to see how things go in the next 4 weeks. Please order uric acid and schedule. TY.

## 2018-03-02 NOTE — Telephone Encounter (Signed)
Scheduled  Lab appt/put in order.

## 2018-03-02 NOTE — Telephone Encounter (Signed)
Copied from [place] [id number]. Topic: General - Other >> Mar 02, 2018  9:00 AM Yvette Rack wrote: Reason for CRM: pt calling stating that Dr Nani Ravens had wanted him to take a gout medicine and that he wanted him to take it daily pt would like to use gout medicine now he uses the   [place], [place] - [address] [phone] (Phone)  [phone] (Fax)   Pt lt wrist and rt foot is inflamed

## 2018-03-09 ENCOUNTER — Other Ambulatory Visit: Payer: Self-pay | Admitting: Family Medicine

## 2018-03-09 ENCOUNTER — Other Ambulatory Visit: Payer: BLUE CROSS/BLUE SHIELD

## 2018-03-09 ENCOUNTER — Other Ambulatory Visit (INDEPENDENT_AMBULATORY_CARE_PROVIDER_SITE_OTHER): Payer: BLUE CROSS/BLUE SHIELD

## 2018-03-09 DIAGNOSIS — M654 Radial styloid tenosynovitis [de Quervain]: Secondary | ICD-10-CM | POA: Diagnosis not present

## 2018-03-09 DIAGNOSIS — M79642 Pain in left hand: Secondary | ICD-10-CM | POA: Diagnosis not present

## 2018-03-09 DIAGNOSIS — M1000 Idiopathic gout, unspecified site: Secondary | ICD-10-CM

## 2018-03-09 LAB — URIC ACID: Uric Acid, Serum: 7.4 mg/dL (ref 4.0–7.8)

## 2018-03-09 MED ORDER — ALLOPURINOL 100 MG PO TABS: 200.0000 mg | ORAL_TABLET | Freq: Every day | ORAL | 1 refills | Status: DC

## 2018-04-14 ENCOUNTER — Other Ambulatory Visit (INDEPENDENT_AMBULATORY_CARE_PROVIDER_SITE_OTHER): Payer: BLUE CROSS/BLUE SHIELD

## 2018-04-14 DIAGNOSIS — M1000 Idiopathic gout, unspecified site: Secondary | ICD-10-CM

## 2018-04-14 LAB — URIC ACID: Uric Acid, Serum: 6.4 mg/dL (ref 4.0–7.8)

## 2018-05-09 ENCOUNTER — Other Ambulatory Visit: Payer: Self-pay | Admitting: Family Medicine

## 2018-06-12 ENCOUNTER — Telehealth: Payer: Self-pay

## 2018-06-12 DIAGNOSIS — M1A9XX0 Chronic gout, unspecified, without tophus (tophi): Secondary | ICD-10-CM

## 2018-06-12 MED ORDER — ALLOPURINOL 100 MG PO TABS: 200.0000 mg | ORAL_TABLET | Freq: Every day | ORAL | 0 refills | Status: DC

## 2018-06-12 NOTE — Telephone Encounter (Signed)
Author phoned pt. to clarify need for allopurinol in light of receiving new rx request from [employer] via fax. Pt. [facility] did not fill the rx on 8/6 because insurance did not cover it, so [facility] is his new preferred pharmacy. Records updated, and allopurinol refilled per protocol. Author reminded pt. About upcoming appointment with Dr. Nani Ravens, and pt. Appreciative.

## 2018-07-24 ENCOUNTER — Encounter: Payer: Self-pay | Admitting: Family Medicine

## 2018-07-24 ENCOUNTER — Ambulatory Visit (INDEPENDENT_AMBULATORY_CARE_PROVIDER_SITE_OTHER): Payer: BLUE CROSS/BLUE SHIELD | Admitting: Family Medicine

## 2018-07-24 VITALS — BP 110/78 | HR 65 | Temp 98.4°F | Ht 74.0 in | Wt 263.0 lb

## 2018-07-24 DIAGNOSIS — Z0000 Encounter for general adult medical examination without abnormal findings: Secondary | ICD-10-CM | POA: Diagnosis not present

## 2018-07-24 LAB — LIPID PANEL
CHOL/HDL RATIO: 3
Cholesterol: 109 mg/dL (ref 0–200)
HDL: 37.4 mg/dL — ABNORMAL LOW (ref >39.00)
LDL CALC: 42 mg/dL (ref 0–99)
NonHDL: 71.72
Triglycerides: 148 mg/dL (ref 0.0–149.0)
VLDL: 29.6 mg/dL (ref 0.0–40.0)

## 2018-07-24 LAB — COMPREHENSIVE METABOLIC PANEL
ALBUMIN: 4.4 g/dL (ref 3.5–5.2)
ALT: 28 U/L (ref 0–53)
AST: 28 U/L (ref 0–37)
Alkaline Phosphatase: 74 U/L (ref 39–117)
BUN: 19 mg/dL (ref 6–23)
CHLORIDE: 101 meq/L (ref 96–112)
CO2: 28 meq/L (ref 19–32)
Calcium: 9.3 mg/dL (ref 8.4–10.5)
Creatinine, Ser: 1.01 mg/dL (ref 0.40–1.50)
GFR: 81.77 mL/min (ref >60.00)
Glucose, Bld: 84 mg/dL (ref 70–99)
POTASSIUM: 4 meq/L (ref 3.5–5.1)
SODIUM: 139 meq/L (ref 135–145)
Total Bilirubin: 0.9 mg/dL (ref 0.2–1.2)
Total Protein: 7.1 g/dL (ref 6.0–8.3)

## 2018-07-24 LAB — URIC ACID: URIC ACID, SERUM: 5.7 mg/dL (ref 4.0–7.8)

## 2018-07-24 MED ORDER — VALACYCLOVIR HCL 500 MG PO TABS: ORAL_TABLET | ORAL | 2 refills | Status: DC

## 2018-07-24 NOTE — Patient Instructions (Signed)
Give [place] 2-3 business days to get the results of your labs back.   The new Shingrix vaccine (for shingles) is a 2 shot series. It can make people feel low energy, achy and almost like they have the flu for 48 hours after injection. Please plan accordingly when deciding on when to get this shot. Call our office for a nurse visit appointment to get this. The second shot of the series is less severe regarding the side effects, but it still lasts 48 hours.   Keep the diet clean and stay active.   Let us know if you need anything.

## 2018-07-24 NOTE — Progress Notes (Signed)
Chief Complaint  Patient presents with  . Annual Exam    Well Male Corey Nguyen is here for a complete physical.   His last physical was >1 year ago.  Current diet: in general, a "pretty good" diet.  Current exercise: has been going to the gym and working with trainers Weight trend: Decreasing steadily Daytime fatigue? No. Seat belt? Yes.    Health maintenance Shingrix- No Colonoscopy- Yes Tetanus- Yes HIV- Yes Hep C- Yes Prostate cancer screening- No   Past Medical History:  Diagnosis Date  . Chicken pox    childhood  . Hyperlipidemia    borderline  . Hypertension   . Hypertriglyceridemia 07/13/2017  . Idiopathic gout of knee   . Measles    childhood  . Mumps    childhood  . Sleep apnea    CPAP      Past Surgical History:  Procedure Laterality Date  . TESTICLE REMOVAL     Age 61-Undescended    Medications  Current Outpatient Medications on File Prior to Visit  Medication Sig Dispense Refill  . allopurinol (ZYLOPRIM) 100 MG tablet Take 2 tablets (200 mg total) by mouth daily. 180 tablet 0  . atorvastatin (LIPITOR) 80 MG tablet Take 1 tablet (80 mg total) by mouth daily. 90 tablet 3  . lisinopril-hydrochlorothiazide (PRINZIDE,ZESTORETIC) 10-12.5 MG tablet Take 1 tablet by mouth daily. 90 tablet 3  . Multiple Vitamin (MULTI VITAMIN MENS PO) Take 1 tablet by mouth daily.    . naproxen (NAPROSYN) 500 MG tablet When you have a flare, take 2 tabs daily for 5 days. 30 tablet 2  . Omega-3 Fatty Acids (FISH OIL) 1000 MG CAPS Take 1 capsule by mouth daily.    . Wheat Dextrin (BENEFIBER DRINK MIX PO) Take by mouth. Take as directed.      Allergies No Known Allergies  Family History Family History  Problem Relation Age of Onset  . Hypercholesterolemia Father        Living  . Hypercholesterolemia Brother   . Healthy Mother        Living  . Diabetes Maternal Grandfather        Insulin  . Heart defect Maternal Grandmother   . Alzheimer's disease Maternal  Grandmother   . Prostate cancer Paternal Grandfather   . Healthy Paternal Grandmother        Living  . Healthy Paternal Uncle   . Diabetes Maternal Aunt   . Healthy Maternal Aunt   . Migraines Daughter   . Healthy Other        Siblings    Review of Systems: Constitutional:  no fevers Eye:  no recent significant change in vision Ear/Nose/Mouth/Throat:  Ears:  no hearing loss Nose/Mouth/Throat:  no complaints of nasal congestion, no sore throat Cardiovascular:  no chest pain, no palpitations Respiratory:  no cough and no shortness of breath Gastrointestinal:  no abdominal pain, no change in bowel habits GU:  Male: negative for dysuria, frequency, and incontinence and negative for prostate symptoms Musculoskeletal/Extremities:  no pain, redness, or swelling of the joints Integumentary (Skin/Breast):  no abnormal skin lesions reported Neurologic:  no headaches Endocrine: No unexpected weight changes Hematologic/Lymphatic:  no abnormal bleeding  Exam BP 110/78 (BP Location: Left Arm, Patient Position: Sitting, Cuff Size: Large)   Pulse 65   Temp 98.4 F (36.9 C) (Oral)   Ht 6\' 2"  (1.88 m)   Wt 263 lb (119.3 kg)   SpO2 96%   BMI 33.77 kg/m  General:  well developed, well nourished, in no apparent distress Skin:  no significant moles, warts, or growths Head:  no masses, lesions, or tenderness Eyes:  pupils equal and round, sclera anicteric without injection Ears:  canals without lesions, TMs shiny without retraction, no obvious effusion, no erythema Nose:  nares patent, septum midline, mucosa normal Throat/Pharynx:  lips and gingiva without lesion; tongue and uvula midline; non-inflamed pharynx; no exudates or postnasal drainage Neck: neck supple without adenopathy, thyromegaly, or masses Lungs:  clear to auscultation, breath sounds equal bilaterally, no respiratory distress Cardio:  regular rate and rhythm, no LE edema, no bruits Abdomen:  abdomen soft, nontender; bowel  sounds normal; no masses or organomegaly Genital (male): circumcised penis, no lesions or discharge; testicle on R present without masses or tenderness; absent on L Rectal: Deferred Musculoskeletal:  symmetrical muscle groups noted without atrophy or deformity Extremities:  no clubbing, cyanosis, or edema, no deformities, no skin discoloration Neuro:  gait normal; deep tendon reflexes normal and symmetric Psych: well oriented with normal range of affect and appropriate judgment/insight  Assessment and Plan  Well adult exam - Plan: Lipid panel, Comprehensive metabolic panel, Uric acid   Well 52 y.o. male. Counseled on diet and exercise. Counseled on risks and benefits of prostate cancer screening with PSA. The patient agrees to forego testing. Discussed Shingrix, info given, will hold off for now as he has to work.  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  Immunizations, labs, and further orders as above. Follow up in 6 mo or prn. The patient voiced understanding and agreement to the plan.  [place], DO 07/24/18 8:01 AM

## 2018-07-24 NOTE — Progress Notes (Signed)
Pre visit review using our clinic review tool, if applicable. No additional management support is needed unless otherwise documented below in the visit note. 

## 2018-09-01 ENCOUNTER — Other Ambulatory Visit: Payer: Self-pay | Admitting: Family Medicine

## 2018-09-01 DIAGNOSIS — M1A9XX0 Chronic gout, unspecified, without tophus (tophi): Secondary | ICD-10-CM

## 2018-09-01 DIAGNOSIS — I10 Essential (primary) hypertension: Secondary | ICD-10-CM

## 2018-09-05 ENCOUNTER — Other Ambulatory Visit: Payer: Self-pay | Admitting: Family Medicine

## 2018-09-05 DIAGNOSIS — M1A9XX0 Chronic gout, unspecified, without tophus (tophi): Secondary | ICD-10-CM

## 2018-09-06 MED ORDER — ALLOPURINOL 100 MG PO TABS: 200.0000 mg | ORAL_TABLET | Freq: Every day | ORAL | 2 refills | Status: DC

## 2018-09-06 NOTE — Telephone Encounter (Signed)
No sig on script.

## 2018-11-03 DIAGNOSIS — M9903 Segmental and somatic dysfunction of lumbar region: Secondary | ICD-10-CM | POA: Diagnosis not present

## 2018-11-03 DIAGNOSIS — M9902 Segmental and somatic dysfunction of thoracic region: Secondary | ICD-10-CM | POA: Diagnosis not present

## 2018-11-03 DIAGNOSIS — R293 Abnormal posture: Secondary | ICD-10-CM | POA: Diagnosis not present

## 2018-11-03 DIAGNOSIS — M9901 Segmental and somatic dysfunction of cervical region: Secondary | ICD-10-CM | POA: Diagnosis not present

## 2018-11-06 DIAGNOSIS — M9902 Segmental and somatic dysfunction of thoracic region: Secondary | ICD-10-CM | POA: Diagnosis not present

## 2018-11-06 DIAGNOSIS — M9903 Segmental and somatic dysfunction of lumbar region: Secondary | ICD-10-CM | POA: Diagnosis not present

## 2018-11-06 DIAGNOSIS — M9901 Segmental and somatic dysfunction of cervical region: Secondary | ICD-10-CM | POA: Diagnosis not present

## 2018-11-06 DIAGNOSIS — R293 Abnormal posture: Secondary | ICD-10-CM | POA: Diagnosis not present

## 2018-11-10 DIAGNOSIS — M9902 Segmental and somatic dysfunction of thoracic region: Secondary | ICD-10-CM | POA: Diagnosis not present

## 2018-11-10 DIAGNOSIS — M9903 Segmental and somatic dysfunction of lumbar region: Secondary | ICD-10-CM | POA: Diagnosis not present

## 2018-11-10 DIAGNOSIS — M9901 Segmental and somatic dysfunction of cervical region: Secondary | ICD-10-CM | POA: Diagnosis not present

## 2018-11-10 DIAGNOSIS — R293 Abnormal posture: Secondary | ICD-10-CM | POA: Diagnosis not present

## 2018-11-20 DIAGNOSIS — R293 Abnormal posture: Secondary | ICD-10-CM | POA: Diagnosis not present

## 2018-11-20 DIAGNOSIS — M9901 Segmental and somatic dysfunction of cervical region: Secondary | ICD-10-CM | POA: Diagnosis not present

## 2018-11-20 DIAGNOSIS — M9902 Segmental and somatic dysfunction of thoracic region: Secondary | ICD-10-CM | POA: Diagnosis not present

## 2018-11-20 DIAGNOSIS — M9903 Segmental and somatic dysfunction of lumbar region: Secondary | ICD-10-CM | POA: Diagnosis not present

## 2018-11-24 DIAGNOSIS — M9903 Segmental and somatic dysfunction of lumbar region: Secondary | ICD-10-CM | POA: Diagnosis not present

## 2018-11-24 DIAGNOSIS — R293 Abnormal posture: Secondary | ICD-10-CM | POA: Diagnosis not present

## 2018-11-24 DIAGNOSIS — M9901 Segmental and somatic dysfunction of cervical region: Secondary | ICD-10-CM | POA: Diagnosis not present

## 2018-11-24 DIAGNOSIS — M9902 Segmental and somatic dysfunction of thoracic region: Secondary | ICD-10-CM | POA: Diagnosis not present

## 2018-11-30 DIAGNOSIS — M9902 Segmental and somatic dysfunction of thoracic region: Secondary | ICD-10-CM | POA: Diagnosis not present

## 2018-11-30 DIAGNOSIS — M9903 Segmental and somatic dysfunction of lumbar region: Secondary | ICD-10-CM | POA: Diagnosis not present

## 2018-11-30 DIAGNOSIS — M9901 Segmental and somatic dysfunction of cervical region: Secondary | ICD-10-CM | POA: Diagnosis not present

## 2018-11-30 DIAGNOSIS — R293 Abnormal posture: Secondary | ICD-10-CM | POA: Diagnosis not present

## 2018-12-01 DIAGNOSIS — M9902 Segmental and somatic dysfunction of thoracic region: Secondary | ICD-10-CM | POA: Diagnosis not present

## 2018-12-01 DIAGNOSIS — M9901 Segmental and somatic dysfunction of cervical region: Secondary | ICD-10-CM | POA: Diagnosis not present

## 2018-12-01 DIAGNOSIS — R293 Abnormal posture: Secondary | ICD-10-CM | POA: Diagnosis not present

## 2018-12-01 DIAGNOSIS — M9903 Segmental and somatic dysfunction of lumbar region: Secondary | ICD-10-CM | POA: Diagnosis not present

## 2018-12-06 DIAGNOSIS — M9903 Segmental and somatic dysfunction of lumbar region: Secondary | ICD-10-CM | POA: Diagnosis not present

## 2018-12-06 DIAGNOSIS — R293 Abnormal posture: Secondary | ICD-10-CM | POA: Diagnosis not present

## 2018-12-06 DIAGNOSIS — M9902 Segmental and somatic dysfunction of thoracic region: Secondary | ICD-10-CM | POA: Diagnosis not present

## 2018-12-06 DIAGNOSIS — M9901 Segmental and somatic dysfunction of cervical region: Secondary | ICD-10-CM | POA: Diagnosis not present

## 2018-12-11 DIAGNOSIS — M9901 Segmental and somatic dysfunction of cervical region: Secondary | ICD-10-CM | POA: Diagnosis not present

## 2018-12-11 DIAGNOSIS — M9902 Segmental and somatic dysfunction of thoracic region: Secondary | ICD-10-CM | POA: Diagnosis not present

## 2018-12-11 DIAGNOSIS — M9903 Segmental and somatic dysfunction of lumbar region: Secondary | ICD-10-CM | POA: Diagnosis not present

## 2018-12-11 DIAGNOSIS — R293 Abnormal posture: Secondary | ICD-10-CM | POA: Diagnosis not present

## 2018-12-13 DIAGNOSIS — M9903 Segmental and somatic dysfunction of lumbar region: Secondary | ICD-10-CM | POA: Diagnosis not present

## 2018-12-13 DIAGNOSIS — M9901 Segmental and somatic dysfunction of cervical region: Secondary | ICD-10-CM | POA: Diagnosis not present

## 2018-12-13 DIAGNOSIS — R293 Abnormal posture: Secondary | ICD-10-CM | POA: Diagnosis not present

## 2018-12-13 DIAGNOSIS — M9902 Segmental and somatic dysfunction of thoracic region: Secondary | ICD-10-CM | POA: Diagnosis not present

## 2018-12-20 DIAGNOSIS — M9901 Segmental and somatic dysfunction of cervical region: Secondary | ICD-10-CM | POA: Diagnosis not present

## 2018-12-20 DIAGNOSIS — R293 Abnormal posture: Secondary | ICD-10-CM | POA: Diagnosis not present

## 2018-12-20 DIAGNOSIS — M9903 Segmental and somatic dysfunction of lumbar region: Secondary | ICD-10-CM | POA: Diagnosis not present

## 2018-12-20 DIAGNOSIS — M9902 Segmental and somatic dysfunction of thoracic region: Secondary | ICD-10-CM | POA: Diagnosis not present

## 2018-12-22 DIAGNOSIS — M9901 Segmental and somatic dysfunction of cervical region: Secondary | ICD-10-CM | POA: Diagnosis not present

## 2018-12-22 DIAGNOSIS — M9903 Segmental and somatic dysfunction of lumbar region: Secondary | ICD-10-CM | POA: Diagnosis not present

## 2018-12-22 DIAGNOSIS — R293 Abnormal posture: Secondary | ICD-10-CM | POA: Diagnosis not present

## 2018-12-22 DIAGNOSIS — M9902 Segmental and somatic dysfunction of thoracic region: Secondary | ICD-10-CM | POA: Diagnosis not present

## 2019-01-26 ENCOUNTER — Encounter: Payer: Self-pay | Admitting: Family Medicine

## 2019-01-26 ENCOUNTER — Other Ambulatory Visit: Payer: Self-pay

## 2019-01-26 ENCOUNTER — Ambulatory Visit (INDEPENDENT_AMBULATORY_CARE_PROVIDER_SITE_OTHER): Payer: BLUE CROSS/BLUE SHIELD | Admitting: Family Medicine

## 2019-01-26 ENCOUNTER — Telehealth: Payer: Self-pay | Admitting: Family Medicine

## 2019-01-26 DIAGNOSIS — I10 Essential (primary) hypertension: Secondary | ICD-10-CM | POA: Diagnosis not present

## 2019-01-26 DIAGNOSIS — E781 Pure hyperglyceridemia: Secondary | ICD-10-CM

## 2019-01-26 DIAGNOSIS — M1A9XX0 Chronic gout, unspecified, without tophus (tophi): Secondary | ICD-10-CM | POA: Diagnosis not present

## 2019-01-26 NOTE — Progress Notes (Signed)
CC: Med ck  Subjective Corey Nguyen is a 53 y.o. male who presents for hypertension follow up. Due to outbreak, we are interacting via web portal for an electronic face-to-face visit. I verified patient's ID using 2 identifiers.  He does not monitor home blood pressures. He is compliant with medications- Prinzide 10-12.5 mg/d. Patient has these side effects of medication: none He is adhering to a healthy diet overall. Current exercise: was going to gym, has been walking, online classes  Hyperlipidemia Patient presents for hypertriglyceridemia follow up. Currently being treated with Lipitor 80 mg/d and compliance with treatment thus far has been good. He denies myalgias. He is adhering to a healthy diet. Exercise: walking, home aerobics The patient is not known to have coexisting coronary artery disease.  Hx of gout affected R 1st MTP. Currently on allopurinol 100 mg/d. Tolerating well, no AE's, compliant. No recent flares over past 6 mo. He is monitoring his diet that could increase his risk for flares. Naproxen for flares is helpful.     Past Medical History:  Diagnosis Date  . Chicken pox    childhood  . Hyperlipidemia    borderline  . Hypertension   . Hypertriglyceridemia 07/13/2017  . Idiopathic gout of knee   . Measles    childhood  . Mumps    childhood  . Sleep apnea    CPAP    Review of Systems Cardiovascular: no chest pain Respiratory:  no shortness of breath  Exam No conversational dyspnea Age appropriate judgment and insight Nml affect and mood  Essential hypertension, benign  Hypertriglyceridemia  Chronic gout involving toe of right foot without tophus, unspecified cause  1- Cont Prinzide. Ck BP at home to ensure he is not dropping too low. Counseled on diet and exercise. 2- Cont statin. 3- Cont allopurinol. Sent message with gout diet info.  F/u in 6 mo for CPE. The patient voiced understanding and agreement to the plan.  [place], DO 01/26/19  8:19 AM

## 2019-01-26 NOTE — Telephone Encounter (Signed)
LVM FOR PT TO CALL AND SCHEDULE CPE

## 2019-02-17 ENCOUNTER — Other Ambulatory Visit: Payer: Self-pay | Admitting: Family Medicine

## 2019-02-17 DIAGNOSIS — E781 Pure hyperglyceridemia: Secondary | ICD-10-CM

## 2019-03-28 ENCOUNTER — Other Ambulatory Visit: Payer: Self-pay | Admitting: Family Medicine

## 2019-03-28 DIAGNOSIS — I10 Essential (primary) hypertension: Secondary | ICD-10-CM

## 2019-04-12 DIAGNOSIS — M7632 Iliotibial band syndrome, left leg: Secondary | ICD-10-CM | POA: Diagnosis not present

## 2019-04-12 DIAGNOSIS — M654 Radial styloid tenosynovitis [de Quervain]: Secondary | ICD-10-CM | POA: Diagnosis not present

## 2019-04-12 DIAGNOSIS — M25562 Pain in left knee: Secondary | ICD-10-CM | POA: Diagnosis not present

## 2019-04-25 DIAGNOSIS — R2681 Unsteadiness on feet: Secondary | ICD-10-CM | POA: Diagnosis not present

## 2019-04-25 DIAGNOSIS — M25562 Pain in left knee: Secondary | ICD-10-CM | POA: Diagnosis not present

## 2019-04-25 DIAGNOSIS — M25362 Other instability, left knee: Secondary | ICD-10-CM | POA: Diagnosis not present

## 2019-05-09 DIAGNOSIS — M25562 Pain in left knee: Secondary | ICD-10-CM | POA: Diagnosis not present

## 2019-05-09 DIAGNOSIS — M6281 Muscle weakness (generalized): Secondary | ICD-10-CM | POA: Diagnosis not present

## 2019-07-28 DIAGNOSIS — Z20828 Contact with and (suspected) exposure to other viral communicable diseases: Secondary | ICD-10-CM | POA: Diagnosis not present

## 2019-08-02 ENCOUNTER — Other Ambulatory Visit: Payer: Self-pay

## 2019-08-03 ENCOUNTER — Encounter: Payer: Self-pay | Admitting: Family Medicine

## 2019-08-03 ENCOUNTER — Ambulatory Visit (INDEPENDENT_AMBULATORY_CARE_PROVIDER_SITE_OTHER): Payer: BC Managed Care – PPO | Admitting: Family Medicine

## 2019-08-03 ENCOUNTER — Other Ambulatory Visit: Payer: Self-pay

## 2019-08-03 ENCOUNTER — Other Ambulatory Visit: Payer: Self-pay | Admitting: Family Medicine

## 2019-08-03 VITALS — BP 120/78 | HR 58 | Temp 97.0°F | Ht 74.0 in | Wt 221.5 lb

## 2019-08-03 DIAGNOSIS — Z0000 Encounter for general adult medical examination without abnormal findings: Secondary | ICD-10-CM

## 2019-08-03 DIAGNOSIS — R6882 Decreased libido: Secondary | ICD-10-CM

## 2019-08-03 DIAGNOSIS — Z1283 Encounter for screening for malignant neoplasm of skin: Secondary | ICD-10-CM | POA: Diagnosis not present

## 2019-08-03 DIAGNOSIS — E781 Pure hyperglyceridemia: Secondary | ICD-10-CM

## 2019-08-03 LAB — CBC
HCT: 43.9 % (ref 39.0–52.0)
Hemoglobin: 15.3 g/dL (ref 13.0–17.0)
MCHC: 34.8 g/dL (ref 30.0–36.0)
MCV: 84 fl (ref 78.0–100.0)
Platelets: 161 10*3/uL (ref 150.0–400.0)
RBC: 5.23 Mil/uL (ref 4.22–5.81)
RDW: 13.4 % (ref 11.5–15.5)
WBC: 5.1 10*3/uL (ref 4.0–10.5)

## 2019-08-03 LAB — COMPREHENSIVE METABOLIC PANEL
ALT: 27 U/L (ref 0–53)
AST: 27 U/L (ref 0–37)
Albumin: 4.7 g/dL (ref 3.5–5.2)
Alkaline Phosphatase: 80 U/L (ref 39–117)
BUN: 24 mg/dL — ABNORMAL HIGH (ref 6–23)
CO2: 29 mEq/L (ref 19–32)
Calcium: 9.3 mg/dL (ref 8.4–10.5)
Chloride: 101 mEq/L (ref 96–112)
Creatinine, Ser: 1 mg/dL (ref 0.40–1.50)
GFR: 77.53 mL/min (ref >60.00)
Glucose, Bld: 78 mg/dL (ref 70–99)
Potassium: 4 mEq/L (ref 3.5–5.1)
Sodium: 137 mEq/L (ref 135–145)
Total Bilirubin: 0.9 mg/dL (ref 0.2–1.2)
Total Protein: 7 g/dL (ref 6.0–8.3)

## 2019-08-03 LAB — TSH: TSH: 1.02 u[IU]/mL (ref 0.35–4.50)

## 2019-08-03 LAB — LIPID PANEL
Cholesterol: 94 mg/dL (ref 0–200)
HDL: 45.9 mg/dL (ref >39.00)
LDL Cholesterol: 36 mg/dL (ref 0–99)
NonHDL: 48.23
Total CHOL/HDL Ratio: 2
Triglycerides: 60 mg/dL (ref 0.0–149.0)
VLDL: 12 mg/dL (ref 0.0–40.0)

## 2019-08-03 LAB — URIC ACID: Uric Acid, Serum: 5.5 mg/dL (ref 4.0–7.8)

## 2019-08-03 LAB — TESTOSTERONE: Testosterone: 466.12 ng/dL (ref 300.00–890.00)

## 2019-08-03 MED ORDER — ATORVASTATIN CALCIUM 20 MG PO TABS: 20.0000 mg | ORAL_TABLET | Freq: Every day | ORAL | 3 refills | Status: DC

## 2019-08-03 NOTE — Progress Notes (Signed)
Chief Complaint  Patient presents with  . Annual Exam    Well Male Corey Nguyen is here for a complete physical.   His last physical was >1 year ago.  Current diet: in general, a "healthy" diet.  Current exercise: cardio, wts, stretching Weight trend: intentionally decreasing Daytime fatigue? No. Seat belt? Yes.    Patient has been undergoing quite a weight loss as he has increased his physical activity and improved his diet.  He is curious what his testosterone levels are.  He is not having any fatigue but libido could be better.  Erections are largely satisfactory.  Health maintenance Shingrix- No Colonoscopy- Yes Tetanus- Yes HIV- Yes Hep C- Yes   Past Medical History:  Diagnosis Date  . Chicken pox    childhood  . Hyperlipidemia    borderline  . Hypertension   . Hypertriglyceridemia 07/13/2017  . Idiopathic gout of knee   . Measles    childhood  . Mumps    childhood  . Sleep apnea    CPAP      Past Surgical History:  Procedure Laterality Date  . TESTICLE REMOVAL     Age 73-Undescended    Medications  Current Outpatient Medications on File Prior to Visit  Medication Sig Dispense Refill  . allopurinol (ZYLOPRIM) 100 MG tablet Take 2 tablets (200 mg total) by mouth daily. 180 tablet 2  . atorvastatin (LIPITOR) 80 MG tablet TAKE 1 TABLET DAILY 90 tablet 3  . Multiple Vitamin (MULTI VITAMIN MENS PO) Take 1 tablet by mouth daily.    . naproxen (NAPROSYN) 500 MG tablet When you have a flare, take 2 tabs daily for 5 days. 30 tablet 2  . Omega-3 Fatty Acids (FISH OIL) 1000 MG CAPS Take 1 capsule by mouth daily.    . valACYclovir (VALTREX) 500 MG tablet Take 2 tabs daily for 5 days at onset of outbreaks. 60 tablet 2  . Wheat Dextrin (BENEFIBER DRINK MIX PO) Take by mouth. Take as directed.     Allergies No Known Allergies  Family History Family History  Problem Relation Age of Onset  . Hypercholesterolemia Father        Living  . Hypercholesterolemia  Brother   . Healthy Mother        Living  . Diabetes Maternal Grandfather        Insulin  . Heart defect Maternal Grandmother   . Alzheimer's disease Maternal Grandmother   . Prostate cancer Paternal Grandfather   . Healthy Paternal Grandmother        Living  . Healthy Paternal Uncle   . Diabetes Maternal Aunt   . Healthy Maternal Aunt   . Migraines Daughter   . Healthy Other        Siblings    Review of Systems: Constitutional:  no fevers Eye:  no recent significant change in vision Ear/Nose/Mouth/Throat:  Ears:  no hearing loss Nose/Mouth/Throat:  no complaints of nasal congestion, no sore throat Cardiovascular:  no chest pain, no palpitations Respiratory:  no cough and no shortness of breath Gastrointestinal:  no abdominal pain, no change in bowel habits GU:  Male: negative for dysuria, frequency, and incontinence and negative for prostate symptoms Musculoskeletal/Extremities:  no pain, redness, or swelling of the joints Integumentary (Skin/Breast):  no abnormal skin lesions reported Neurologic:  no headaches Endocrine: No unexpected weight changes Hematologic/Lymphatic:  no abnormal bleeding  Exam BP 120/78 (BP Location: Left Arm, Patient Position: Sitting, Cuff Size: Normal)   Pulse (!) 58  Temp (!) 97 F (36.1 C) (Temporal)   Ht 6\' 2"  (1.88 m)   Wt 221 lb 8 oz (100.5 kg)   SpO2 93%   BMI 28.44 kg/m  General:  well developed, well nourished, in no apparent distress Skin:  no significant moles, warts, or growths Head:  no masses, lesions, or tenderness Eyes:  pupils equal and round, sclera anicteric without injection Ears:  canals without lesions, TMs shiny without retraction, no obvious effusion, no erythema Nose:  nares patent, septum midline, mucosa normal Throat/Pharynx:  lips and gingiva without lesion; tongue and uvula midline; non-inflamed pharynx; no exudates or postnasal drainage Neck: neck supple without adenopathy, thyromegaly, or masses Lungs:   clear to auscultation, breath sounds equal bilaterally, no respiratory distress Cardio:  regular rate and rhythm, no LE edema, no bruits Abdomen:  abdomen soft, nontender; bowel sounds normal; no masses or organomegaly Rectal: Deferred Musculoskeletal:  symmetrical muscle groups noted without atrophy or deformity Extremities:  no clubbing, cyanosis, or edema, no deformities, no skin discoloration Neuro:  gait normal; deep tendon reflexes normal and symmetric Psych: well oriented with normal range of affect and appropriate judgment/insight  Assessment and Plan  Well adult exam - Plan: CBC, Comprehensive metabolic panel, Lipid panel, Uric acid, TSH  Decreased libido - Plan: Testosterone  Skin cancer screening - Plan: Ambulatory referral to Dermatology   Well 53 y.o. male. Counseled on diet and exercise. Counseled on risks and benefits of prostate cancer screening with PSA. The patient agrees to forego testing. We will check a testosterone in addition to his basic labs.  I did counsel on likelihood of him requiring replacement being low. Also due to his terrific weight loss, we will take away his blood pressure medication.  He will monitor at home.  We may also take away his cholesterol medication or start to wean depending on his results.  This is for primary prevention due to elevated cholesterol. Immunizations, labs, and further orders as above. Follow up 6 months. The patient voiced understanding and agreement to the plan.  [place], DO 08/03/19 8:16 AM

## 2019-08-03 NOTE — Patient Instructions (Addendum)
Give [place] 2-3 business days to get the results of your labs back.   Keep the diet clean and stay active.  Keep an eye on your blood pressure at home. Goal is <140 on the top and <90 on the bottom consistently. Let me know if this is not the case.   If you do not hear anything about your referral in the next 1-2 weeks, call our office and ask for an update.  Let us know if you need anything.

## 2019-08-24 ENCOUNTER — Other Ambulatory Visit: Payer: Self-pay | Admitting: Family Medicine

## 2019-08-24 DIAGNOSIS — M1A9XX0 Chronic gout, unspecified, without tophus (tophi): Secondary | ICD-10-CM

## 2019-08-24 DIAGNOSIS — I10 Essential (primary) hypertension: Secondary | ICD-10-CM

## 2019-11-02 ENCOUNTER — Other Ambulatory Visit (INDEPENDENT_AMBULATORY_CARE_PROVIDER_SITE_OTHER): Payer: BC Managed Care – PPO

## 2019-11-02 ENCOUNTER — Encounter: Payer: Self-pay | Admitting: Family Medicine

## 2019-11-02 ENCOUNTER — Other Ambulatory Visit: Payer: Self-pay

## 2019-11-02 DIAGNOSIS — E781 Pure hyperglyceridemia: Secondary | ICD-10-CM | POA: Diagnosis not present

## 2019-11-02 LAB — LIPID PANEL
Cholesterol: 97 mg/dL (ref 0–200)
HDL: 51.6 mg/dL (ref >39.00)
LDL Cholesterol: 34 mg/dL (ref 0–99)
NonHDL: 45.86
Total CHOL/HDL Ratio: 2
Triglycerides: 61 mg/dL (ref 0.0–149.0)
VLDL: 12.2 mg/dL (ref 0.0–40.0)

## 2019-11-05 ENCOUNTER — Other Ambulatory Visit: Payer: Self-pay | Admitting: Family Medicine

## 2020-02-01 ENCOUNTER — Encounter: Payer: Self-pay | Admitting: Family Medicine

## 2020-02-01 ENCOUNTER — Other Ambulatory Visit: Payer: Self-pay

## 2020-02-01 ENCOUNTER — Ambulatory Visit (INDEPENDENT_AMBULATORY_CARE_PROVIDER_SITE_OTHER): Payer: BC Managed Care – PPO | Admitting: Family Medicine

## 2020-02-01 VITALS — BP 110/72 | HR 57 | Temp 96.0°F | Ht 74.0 in | Wt 213.2 lb

## 2020-02-01 DIAGNOSIS — E781 Pure hyperglyceridemia: Secondary | ICD-10-CM

## 2020-02-01 DIAGNOSIS — M1A9XX0 Chronic gout, unspecified, without tophus (tophi): Secondary | ICD-10-CM

## 2020-02-01 LAB — LIPID PANEL
Cholesterol: 144 mg/dL (ref 0–200)
HDL: 53.9 mg/dL (ref >39.00)
LDL Cholesterol: 72 mg/dL (ref 0–99)
NonHDL: 90.07
Total CHOL/HDL Ratio: 3
Triglycerides: 88 mg/dL (ref 0.0–149.0)
VLDL: 17.6 mg/dL (ref 0.0–40.0)

## 2020-02-01 LAB — URIC ACID: Uric Acid, Serum: 4.4 mg/dL (ref 4.0–7.8)

## 2020-02-01 NOTE — Patient Instructions (Signed)
Give us 2-3 business days to get the results of your labs back.   Keep the diet clean and stay active.  Let us know if you need anything. 

## 2020-02-01 NOTE — Progress Notes (Signed)
Chief Complaint  Patient presents with  . Follow-up    6 month    Subjective Corey Nguyen is a 54 y.o. male who presents for hypertension follow up. He does not monitor home blood pressures. He stopped taking his BP medication due to natural lowering with weight loss and improved diet/exercising.  He is adhering to a healthy diet overall. Current exercise: wt resistance exercise, cardio  Gout +hx of gout. Takes 200 mg allopurinol daily. Compliant, no AE's. No recent flares in past 6 mo. R great toe is affected. Takes naproxen that does help right away.    Past Medical History:  Diagnosis Date  . Chicken pox    childhood  . Hyperlipidemia    borderline  . Hypertension   . Hypertriglyceridemia 07/13/2017  . Idiopathic gout of knee   . Measles    childhood  . Mumps    childhood  . Sleep apnea    CPAP    Exam BP 110/72 (BP Location: Left Arm, Patient Position: Sitting, Cuff Size: Normal)   Pulse (!) 57   Temp (!) 96 F (35.6 C) (Temporal)   Ht 6\' 2"  (1.88 m)   Wt 213 lb 4 oz (96.7 kg)   SpO2 93%   BMI 27.38 kg/m  General:  well developed, well nourished, in no apparent distress Heart: RRR, no bruits, no LE edema Lungs: clear to auscultation, no accessory muscle use Psych: well oriented with normal range of affect and appropriate judgment/insight  Chronic gout involving toe of right foot without tophus, unspecified cause - Plan: Uric acid  Hypertriglyceridemia - Plan: Lipid panel  Orders as above. Counseled on diet and exercise. F/u in 6 mo for CPE. The patient voiced understanding and agreement to the plan.  [place], DO 02/01/20  7:37 AM

## 2020-02-19 ENCOUNTER — Other Ambulatory Visit: Payer: Self-pay | Admitting: Family Medicine

## 2020-02-19 MED ORDER — VALACYCLOVIR HCL 500 MG PO TABS: ORAL_TABLET | ORAL | 2 refills | Status: DC

## 2020-02-20 ENCOUNTER — Telehealth: Payer: Self-pay

## 2020-02-20 NOTE — Telephone Encounter (Signed)
Contacted [facility] to find out clinical questions, they need to know if PCP would be okay with switching Valtrex 500mg  bid to 1000mg  qd instead. Please use message below for callback and reference number.  Okay for switch or leave as is?

## 2020-02-20 NOTE — Telephone Encounter (Signed)
OK to change to 1000 mg daily for 5 days at onset of outbreaks. Ty.

## 2020-02-20 NOTE — Telephone Encounter (Signed)
Corey Nguyen from [facility] called in needing to speak with the nurse or the doctor about the following medication valACYclovir (VALTREX) 500 MG tablet [id number]   Per the pharmacist he has some clinical question about the medication. Please give Corey Nguyen at  [phone]    Ref # [id number]

## 2020-02-21 DIAGNOSIS — M654 Radial styloid tenosynovitis [de Quervain]: Secondary | ICD-10-CM | POA: Diagnosis not present

## 2020-02-22 NOTE — Telephone Encounter (Signed)
[facility] notified of change.

## 2020-04-10 MED ORDER — VALACYCLOVIR HCL 500 MG PO TABS: 500.0000 mg | ORAL_TABLET | Freq: Every day | ORAL | 2 refills | Status: DC

## 2020-04-29 ENCOUNTER — Other Ambulatory Visit: Payer: Self-pay | Admitting: Family Medicine

## 2020-04-29 DIAGNOSIS — M1A9XX0 Chronic gout, unspecified, without tophus (tophi): Secondary | ICD-10-CM

## 2020-05-08 DIAGNOSIS — D225 Melanocytic nevi of trunk: Secondary | ICD-10-CM | POA: Diagnosis not present

## 2020-05-08 DIAGNOSIS — L738 Other specified follicular disorders: Secondary | ICD-10-CM | POA: Diagnosis not present

## 2020-05-08 DIAGNOSIS — D485 Neoplasm of uncertain behavior of skin: Secondary | ICD-10-CM | POA: Diagnosis not present

## 2020-05-08 DIAGNOSIS — D2239 Melanocytic nevi of other parts of face: Secondary | ICD-10-CM | POA: Diagnosis not present

## 2020-05-08 DIAGNOSIS — D224 Melanocytic nevi of scalp and neck: Secondary | ICD-10-CM | POA: Diagnosis not present

## 2020-05-08 DIAGNOSIS — L720 Epidermal cyst: Secondary | ICD-10-CM | POA: Diagnosis not present

## 2020-07-14 ENCOUNTER — Other Ambulatory Visit: Payer: Self-pay | Admitting: Family Medicine

## 2020-07-14 MED ORDER — VALACYCLOVIR HCL 500 MG PO TABS: 500.0000 mg | ORAL_TABLET | Freq: Every day | ORAL | 2 refills | Status: DC

## 2020-07-24 DIAGNOSIS — L720 Epidermal cyst: Secondary | ICD-10-CM | POA: Diagnosis not present

## 2020-07-29 DIAGNOSIS — G4733 Obstructive sleep apnea (adult) (pediatric): Secondary | ICD-10-CM | POA: Diagnosis not present

## 2020-07-30 ENCOUNTER — Other Ambulatory Visit: Payer: Self-pay

## 2020-07-30 ENCOUNTER — Encounter: Payer: Self-pay | Admitting: Family Medicine

## 2020-07-30 ENCOUNTER — Ambulatory Visit (INDEPENDENT_AMBULATORY_CARE_PROVIDER_SITE_OTHER): Payer: BC Managed Care – PPO | Admitting: Family Medicine

## 2020-07-30 VITALS — BP 108/68 | Temp 98.6°F | Ht 74.0 in | Wt 229.2 lb

## 2020-07-30 DIAGNOSIS — M1A9XX0 Chronic gout, unspecified, without tophus (tophi): Secondary | ICD-10-CM | POA: Diagnosis not present

## 2020-07-30 DIAGNOSIS — M25511 Pain in right shoulder: Secondary | ICD-10-CM | POA: Diagnosis not present

## 2020-07-30 DIAGNOSIS — Z0000 Encounter for general adult medical examination without abnormal findings: Secondary | ICD-10-CM

## 2020-07-30 DIAGNOSIS — G8929 Other chronic pain: Secondary | ICD-10-CM | POA: Diagnosis not present

## 2020-07-30 NOTE — Patient Instructions (Addendum)
Give [place] 2-3 business days to get the results of your labs back.   Keep the diet clean and stay active.  The new Shingrix vaccine (for shingles) is a 2 shot series. It can make people feel low energy, achy and almost like they have the flu for 48 hours after injection. Please plan accordingly when deciding on when to get this shot. Call our office for a nurse visit appointment to get this. The second shot of the series is less severe regarding the side effects, but it still lasts 48 hours.   Heat (pad or rice pillow in microwave) over affected area, 10-15 minutes twice daily.   Ice/cold pack over area for 10-15 min twice daily.    If you do not hear anything about your referral in the next 1-2 weeks, call our office and ask for an update.  Let us know if you need anything.  EXERCISES  RANGE OF MOTION (ROM) AND STRETCHING EXERCISES These exercises may help you when beginning to rehabilitate your injury. While completing these exercises, remember:   Restoring tissue flexibility helps normal motion to return to the joints. This allows healthier, less painful movement and activity.  An effective stretch should be held for at least 30 seconds.  A stretch should never be painful. You should only feel a gentle lengthening or release in the stretched tissue.   ROM - Pendulum  Bend at the waist so that your right / left arm falls away from your body. Support yourself with your opposite hand on a solid surface, such as a table or a countertop.  Your right / left arm should be perpendicular to the ground. If it is not perpendicular, you need to lean over farther. Relax the muscles in your right / left arm and shoulder as much as possible.  Gently sway your hips and trunk so they move your right / left arm without any use of your right / left shoulder muscles.  Progress your movements so that your right / left arm moves side to side, then forward and backward, and finally, both clockwise and  counterclockwise.  Complete 10-15 repetitions in each direction. Many people use this exercise to relieve discomfort in their shoulder as well as to gain range of motion. Repeat 2 times. Complete this exercise 3 times per week.  STRETCH - Flexion, Standing  Stand with good posture. With an underhand grip on your right / left hand and an overhand grip on the opposite hand, grasp a broomstick or cane so that your hands are a little more than shoulder-width apart.  Keeping your right / left elbow straight and shoulder muscles relaxed, push the stick with your opposite hand to raise your right / left arm in front of your body and then overhead. Raise your arm until you feel a stretch in your right / left shoulder, but before you have increased shoulder pain.  Try to avoid shrugging your right / left shoulder as your arm rises by keeping your shoulder blade tucked down and toward your mid-back spine. Hold 30 seconds.  Slowly return to the starting position. Repeat 2 times. Complete this exercise 3 times per week.  STRETCH - Internal Rotation  Place your right / left hand behind your back, palm-up.  Throw a towel or belt over your opposite shoulder. Grasp the towel/belt with your right / left hand.  While keeping an upright posture, gently pull up on the towel/belt until you feel a stretch in the front of your  right / left shoulder.  Avoid shrugging your right / left shoulder as your arm rises by keeping your shoulder blade tucked down and toward your mid-back spine.  Hold 30. Release the stretch by lowering your opposite hand. Repeat 2 times. Complete this exercise 3 times per week.  STRETCH - External Rotation and Abduction  Stagger your stance through a doorframe. It does not matter which foot is forward.  As instructed by your physician, physical therapist or athletic trainer, place your hands: ? And forearms above your head and on the door frame. ? And forearms at head-height and on  the door frame. ? At elbow-height and on the door frame.  Keeping your head and chest upright and your stomach muscles tight to prevent over-extending your low-back, slowly shift your weight onto your front foot until you feel a stretch across your chest and/or in the front of your shoulders.  Hold 30 seconds. Shift your weight to your back foot to release the stretch. Repeat 2 times. Complete this stretch 3 times per week.   STRENGTHENING EXERCISES  These exercises may help you when beginning to rehabilitate your injury. They may resolve your symptoms with or without further involvement from your physician, physical therapist or athletic trainer. While completing these exercises, remember:   Muscles can gain both the endurance and the strength needed for everyday activities through controlled exercises.  Complete these exercises as instructed by your physician, physical therapist or athletic trainer. Progress the resistance and repetitions only as guided.  You may experience muscle soreness or fatigue, but the pain or discomfort you are trying to eliminate should never worsen during these exercises. If this pain does worsen, stop and make certain you are following the directions exactly. If the pain is still present after adjustments, discontinue the exercise until you can discuss the trouble with your clinician.  If advised by your physician, during your recovery, avoid activity or exercises which involve actions that place your right / left hand or elbow above your head or behind your back or head. These positions stress the tissues which are trying to heal.  STRENGTH - Scapular Depression and Adduction  With good posture, sit on a firm chair. Supported your arms in front of you with pillows, arm rests or a table top. Have your elbows in line with the sides of your body.  Gently draw your shoulder blades down and toward your mid-back spine. Gradually increase the tension without tensing the  muscles along the top of your shoulders and the back of your neck.  Hold for 3 seconds. Slowly release the tension and relax your muscles completely before completing the next repetition.  After you have practiced this exercise, remove the arm support and complete it in standing as well as sitting. Repeat 2 times. Complete this exercise 3 times per week.   STRENGTH - External Rotators  Secure a rubber exercise band/tubing to a fixed object so that it is at the same height as your right / left elbow when you are standing or sitting on a firm surface.  Stand or sit so that the secured exercise band/tubing is at your side that is not injured.  Bend your elbow 90 degrees. Place a folded towel or small pillow under your right / left arm so that your elbow is a few inches away from your side.  Keeping the tension on the exercise band/tubing, pull it away from your body, as if pivoting on your elbow. Be sure to keep your  body steady so that the movement is only coming from your shoulder rotating.  Hold 3 seconds. Release the tension in a controlled manner as you return to the starting position. Repeat 2 times. Complete this exercise 3 times per week.   STRENGTH - Supraspinatus  Stand or sit with good posture. Grasp a 2-3 lb weight or an exercise band/tubing so that your hand is "thumbs-up," like when you shake hands.  Slowly lift your right / left hand from your thigh into the air, traveling about 30 degrees from straight out at your side. Lift your hand to shoulder height or as far as you can without increasing any shoulder pain. Initially, many people do not lift their hands above shoulder height.  Avoid shrugging your right / left shoulder as your arm rises by keeping your shoulder blade tucked down and toward your mid-back spine.  Hold for 3 seconds. Control the descent of your hand as you slowly return to your starting position. Repeat 2 times. Complete this exercise 3 times per week.    STRENGTH - Shoulder Extensors  Secure a rubber exercise band/tubing so that it is at the height of your shoulders when you are either standing or sitting on a firm arm-less chair.  With a thumbs-up grip, grasp an end of the band/tubing in each hand. Straighten your elbows and lift your hands straight in front of you at shoulder height. Step back away from the secured end of band/tubing until it becomes tense.  Squeezing your shoulder blades together, pull your hands down to the sides of your thighs. Do not allow your hands to go behind you.  Hold for 3 seconds. Slowly ease the tension on the band/tubing as you reverse the directions and return to the starting position. Repeat 2 times. Complete this exercise 3 times per week.   STRENGTH - Scapular Retractors  Secure a rubber exercise band/tubing so that it is at the height of your shoulders when you are either standing or sitting on a firm arm-less chair.  With a palm-down grip, grasp an end of the band/tubing in each hand. Straighten your elbows and lift your hands straight in front of you at shoulder height. Step back away from the secured end of band/tubing until it becomes tense.  Squeezing your shoulder blades together, draw your elbows back as you bend them. Keep your upper arm lifted away from your body throughout the exercise.  Hold 3 seconds. Slowly ease the tension on the band/tubing as you reverse the directions and return to the starting position. Repeat 2 times. Complete this exercise 3 times per week.  STRENGTH - Scapular Depressors  Find a sturdy chair without wheels, such as a from a dining room table.  Keeping your feet on the floor, lift your bottom from the seat and lock your elbows.  Keeping your elbows straight, allow gravity to pull your body weight down. Your shoulders will rise toward your ears.  Raise your body against gravity by drawing your shoulder blades down your back, shortening the distance between  your shoulders and ears. Although your feet should always maintain contact with the floor, your feet should progressively support less body weight as you get stronger.  Hold 3 seconds. In a controlled and slow manner, lower your body weight to begin the next repetition. Repeat 2 times. Complete this exercise 3 times per week.   This information is not intended to replace advice given to you by your health care provider. Make sure you discuss  any questions you have with your health care provider.  Document Released: 08/04/2005 Document Revised: 10/11/2014 Document Reviewed: 01/02/2009 Elsevier Interactive Patient Education [employer].

## 2020-07-30 NOTE — Progress Notes (Signed)
Chief Complaint  Patient presents with   Annual Exam    Well Male Corey Nguyen is here for a complete physical.   His last physical was >1 year ago.  Current diet: in general, a "healthy" diet.  Current exercise: lifting, cardio Weight trend: intentionally increased Fatigue out of ordinary? No. Seat belt? Yes.    Health maintenance Shingrix- No Colonoscopy- Yes Tetanus- Yes HIV- Yes Hep C- Yes   Past Medical History:  Diagnosis Date   Chicken pox    childhood   Hyperlipidemia    borderline   Hypertension    Hypertriglyceridemia 07/13/2017   Idiopathic gout of knee    Measles    childhood   Mumps    childhood   Sleep apnea    CPAP      Past Surgical History:  Procedure Laterality Date   TESTICLE REMOVAL     Age 36-Undescended    Medications  Current Outpatient Medications on File Prior to Visit  Medication Sig Dispense Refill   allopurinol (ZYLOPRIM) 100 MG tablet TAKE 2 TABLETS (200MG       TOTAL) DAILY 180 tablet 2   Multiple Vitamin (MULTI VITAMIN MENS PO) Take 1 tablet by mouth daily.     naproxen (NAPROSYN) 500 MG tablet When you have a flare, take 2 tabs daily for 5 days. 30 tablet 2   Omega-3 Fatty Acids (FISH OIL) 1000 MG CAPS Take 1 capsule by mouth daily.     valACYclovir (VALTREX) 500 MG tablet Take 1 tablet (500 mg total) by mouth daily. 90 tablet 2   Wheat Dextrin (BENEFIBER DRINK MIX PO) Take by mouth. Take as directed.      Allergies No Known Allergies  Family History Family History  Problem Relation Age of Onset   Hypercholesterolemia Father        Living   Hypercholesterolemia Brother    Healthy Mother        Living   Diabetes Maternal Grandfather        Insulin   Heart defect Maternal Grandmother    Alzheimer's disease Maternal Grandmother    Prostate cancer Paternal Grandfather    Healthy Paternal Grandmother        Living   Healthy Paternal Uncle    Diabetes Maternal Aunt    Healthy Maternal  45    Migraines Daughter    Healthy Other        Siblings    Review of Systems: Constitutional:  no fevers Eye:  no recent significant change in vision Ear/Nose/Mouth/Throat:  Ears:  no hearing loss Nose/Mouth/Throat:  no complaints of nasal congestion, no sore throat Cardiovascular:  no chest pain Respiratory:  no shortness of breath Gastrointestinal:  no change in bowel habits GU:  Male: negative for dysuria, frequency Musculoskeletal/Extremities:  +R shoulder pain Integumentary (Skin/Breast):  no abnormal skin lesions reported Neurologic:  no headaches Endocrine: No unexpected weight changes Hematologic/Lymphatic:  no abnormal bleeding  Exam BP 108/68 (BP Location: Left Arm, Patient Position: Sitting, Cuff Size: Normal)    Temp 98.6 F (37 C) (Oral)    Ht 6\' 2"  (1.88 m)    Wt 229 lb 4 oz (104 kg)    BMI 29.43 kg/m  General:  well developed, well nourished, in no apparent distress Skin:  no significant moles, warts, or growths Head:  no masses, lesions, or tenderness Eyes:  pupils equal and round, sclera anicteric without injection Ears:  canals without lesions, TMs shiny without retraction, no obvious effusion, no erythema  Nose:  nares patent, septum midline, mucosa normal Throat/Pharynx:  lips and gingiva without lesion; tongue and uvula midline; non-inflamed pharynx; no exudates or postnasal drainage Neck: neck supple without adenopathy, thyromegaly, or masses Cardiac: RRR, no bruits, no LE edema Lungs:  clear to auscultation, breath sounds equal bilaterally, no respiratory distress Rectal: Deferred Musculoskeletal: R shoulder- nml active/passive ROM overall. No deformity, ecchymosis or erythema. Neg Neer's, hawkins, Empty can, cross over, lift off, speed's. Decreased ROM w IR.  Neuro:  gait normal; deep tendon reflexes normal and symmetric Psych: well oriented with normal range of affect and appropriate judgment/insight  Assessment and Plan  Well adult exam - Plan:  Comprehensive metabolic panel, Lipid panel  Chronic gout involving toe of right foot without tophus, unspecified cause - Plan: Uric acid  Chronic right shoulder pain - Plan: Ambulatory referral to Sports Medicine   Well 54 y.o. male. Counseled on diet and exercise. Counseled on risks and benefits of prostate cancer screening with PSA. The patient agrees to forego testing. For shoulder, I think he has a component of adhesive capsulitis. Stretches/exercises. Will send to sports med for possible [place].  Immunizations, labs, and further orders as above. Follow up in 6 mo. The patient voiced understanding and agreement to the plan.  [place], DO 07/30/20 7:56 AM

## 2020-07-31 LAB — URIC ACID: Uric Acid, Serum: 4.8 mg/dL (ref 4.0–8.0)

## 2020-07-31 LAB — COMPREHENSIVE METABOLIC PANEL
AG Ratio: 1.7 (calc) (ref 1.0–2.5)
ALT: 19 U/L (ref 9–46)
AST: 19 U/L (ref 10–35)
Albumin: 4.5 g/dL (ref 3.6–5.1)
Alkaline phosphatase (APISO): 74 U/L (ref 35–144)
BUN: 24 mg/dL (ref 7–25)
CO2: 29 mmol/L (ref 20–32)
Calcium: 9.3 mg/dL (ref 8.6–10.3)
Chloride: 102 mmol/L (ref 98–110)
Creat: 1.12 mg/dL (ref 0.70–1.33)
Globulin: 2.6 g/dL (calc) (ref 1.9–3.7)
Glucose, Bld: 81 mg/dL (ref 65–99)
Potassium: 4.3 mmol/L (ref 3.5–5.3)
Sodium: 138 mmol/L (ref 135–146)
Total Bilirubin: 0.6 mg/dL (ref 0.2–1.2)
Total Protein: 7.1 g/dL (ref 6.1–8.1)

## 2020-07-31 LAB — LIPID PANEL
Cholesterol: 172 mg/dL (ref <200)
HDL: 53 mg/dL (ref >=40)
LDL Cholesterol (Calc): 99 mg/dL (calc)
Non-HDL Cholesterol (Calc): 119 mg/dL (calc) (ref <130)
Total CHOL/HDL Ratio: 3.2 (calc) (ref <5.0)
Triglycerides: 103 mg/dL (ref <150)

## 2020-08-01 DIAGNOSIS — F4323 Adjustment disorder with mixed anxiety and depressed mood: Secondary | ICD-10-CM | POA: Diagnosis not present

## 2020-08-06 ENCOUNTER — Ambulatory Visit (INDEPENDENT_AMBULATORY_CARE_PROVIDER_SITE_OTHER): Payer: BC Managed Care – PPO | Admitting: Family Medicine

## 2020-08-06 ENCOUNTER — Other Ambulatory Visit: Payer: Self-pay

## 2020-08-06 ENCOUNTER — Encounter: Payer: Self-pay | Admitting: Family Medicine

## 2020-08-06 ENCOUNTER — Ambulatory Visit: Payer: Self-pay

## 2020-08-06 ENCOUNTER — Ambulatory Visit: Payer: BC Managed Care – PPO | Admitting: Family Medicine

## 2020-08-06 VITALS — BP 153/87 | HR 63 | Ht 74.0 in | Wt 225.0 lb

## 2020-08-06 DIAGNOSIS — M778 Other enthesopathies, not elsewhere classified: Secondary | ICD-10-CM | POA: Insufficient documentation

## 2020-08-06 DIAGNOSIS — M65811 Other synovitis and tenosynovitis, right shoulder: Secondary | ICD-10-CM | POA: Diagnosis not present

## 2020-08-06 DIAGNOSIS — G8929 Other chronic pain: Secondary | ICD-10-CM

## 2020-08-06 MED ORDER — PREDNISONE 5 MG PO TABS
ORAL_TABLET | ORAL | 0 refills | Status: DC
Start: 2020-08-06 — End: 2021-02-02

## 2020-08-06 NOTE — Assessment & Plan Note (Signed)
Symptoms seem mostly occurring over the biceps tendon as well as the subscapularis.  Has significant hyperemia over the anterior portion of the shoulder.  Unclear if this is related to his gout or another form of inflammatory change. -Counseled on home exercise therapy and supportive care. -Prednisone. - Could consider injection and lab work.

## 2020-08-06 NOTE — Patient Instructions (Signed)
Nice to meet you Please try the prednisone  You can use the naproxen after the prednisone if you still have pain  Please continue your work outs   Please send me a message in [place] with any questions or updates.  Please see me back in 3 weeks.   --Dr. Raeford Razor

## 2020-08-06 NOTE — Progress Notes (Signed)
CHEVEYO Nguyen - 54 y.o. male MRN [id number]  Date of birth: November 17, 1965  SUBJECTIVE:  Including CC & ROS.  Chief Complaint  Patient presents with  . Shoulder Pain    right x 1.5 months    Corey Nguyen is a 54 y.o. male that is presenting with acute on chronic right shoulder pain.  He is experiencing the pain is worse over the past few weeks.  Has taken ibuprofen.  Has a history of gout.  No previous injury or surgery.  Seems localized to the shoulder.   Review of Systems See HPI   HISTORY: Past Medical, Surgical, Social, and Family History Reviewed & Updated per EMR.   Pertinent Historical Findings include:  Past Medical History:  Diagnosis Date  . Chicken pox    childhood  . Hyperlipidemia    borderline  . Hypertension   . Hypertriglyceridemia 07/13/2017  . Idiopathic gout of knee   . Measles    childhood  . Mumps    childhood  . Sleep apnea    CPAP    Past Surgical History:  Procedure Laterality Date  . TESTICLE REMOVAL     Age 27-Undescended    Family History  Problem Relation Age of Onset  . Hypercholesterolemia Father        Living  . Hypercholesterolemia Brother   . Healthy Mother        Living  . Diabetes Maternal Grandfather        Insulin  . Heart defect Maternal Grandmother   . Alzheimer's disease Maternal Grandmother   . Prostate cancer Paternal Grandfather   . Healthy Paternal Grandmother        Living  . Healthy Paternal Uncle   . Diabetes Maternal Aunt   . Healthy Maternal Aunt   . Migraines Daughter   . Healthy Other        Siblings    Social History   Socioeconomic History  . Marital status: Married    Spouse name: Not on file  . Number of children: Not on file  . Years of education: Not on file  . Highest education level: Not on file  Occupational History  . Not on file  Tobacco Use  . Smoking status: Never Smoker  . Smokeless tobacco: Never Used  Vaping Use  . Vaping Use: Never used  Substance and Sexual Activity  .  Alcohol use: Yes    Alcohol/week: 24.0 standard drinks    Types: 24 Cans of beer per week  . Drug use: No  . Sexual activity: Not on file  Other Topics Concern  . Not on file  Social History Narrative  . Not on file   Social Determinants of Health   Financial Resource Strain:   . Difficulty of Paying Living Expenses: Not on file  Food Insecurity:   . Worried About Charity fundraiser in the Last Year: Not on file  . Ran Out of Food in the Last Year: Not on file  Transportation Needs:   . Lack of Transportation (Medical): Not on file  . Lack of Transportation (Non-Medical): Not on file  Physical Activity:   . Days of Exercise per Week: Not on file  . Minutes of Exercise per Session: Not on file  Stress:   . Feeling of Stress : Not on file  Social Connections:   . Frequency of Communication with Friends and Family: Not on file  . Frequency of Social Gatherings with Friends and Family: Not on  file  . Attends Religious Services: Not on file  . Active Member of Clubs or Organizations: Not on file  . Attends Archivist Meetings: Not on file  . Marital Status: Not on file  Intimate Partner Violence:   . Fear of Current or Ex-Partner: Not on file  . Emotionally Abused: Not on file  . Physically Abused: Not on file  . Sexually Abused: Not on file     PHYSICAL EXAM:  VS: BP (!) 153/87   Pulse 63   Ht 6\' 2"  (1.88 m)   Wt 225 lb (102.1 kg)   BMI 28.89 kg/m  Physical Exam Gen: NAD, alert, cooperative with exam, well-appearing MSK:  Left shoulder: Normal range of motion. Normal strength resistance. Normal empty can test. Normal speeds test. Some pain with O'Brien's test. Neurovascular intact  Limited ultrasound: Right shoulder:  Normal-appearing biceps tendon.  Does have an encircling effusion with significant hyperemia associated with it. Subscapularis appears normal but with large superior appearing bursitis.  Significant hyperemia overlying the  subscapularis and extends over the bicipital tendon. Supraspinatus as chronic degenerative changes but no hyperemia. Normal-appearing AC joint. Normal-appearing posterior glenohumeral joint.  Summary: Significant inflammatory changes over the subscapularis and bicipital tendon.  Ultrasound and interpretation by Clearance Coots, MD    ASSESSMENT & PLAN:   Synovitis of right shoulder Symptoms seem mostly occurring over the biceps tendon as well as the subscapularis.  Has significant hyperemia over the anterior portion of the shoulder.  Unclear if this is related to his gout or another form of inflammatory change. -Counseled on home exercise therapy and supportive care. -Prednisone. - Could consider injection and lab work.

## 2020-08-08 ENCOUNTER — Encounter: Payer: Self-pay | Admitting: Family Medicine

## 2020-08-13 ENCOUNTER — Encounter: Payer: Self-pay | Admitting: Family Medicine

## 2020-09-01 DIAGNOSIS — F4323 Adjustment disorder with mixed anxiety and depressed mood: Secondary | ICD-10-CM | POA: Diagnosis not present

## 2020-09-05 ENCOUNTER — Encounter: Payer: Self-pay | Admitting: Family Medicine

## 2020-09-05 ENCOUNTER — Ambulatory Visit (INDEPENDENT_AMBULATORY_CARE_PROVIDER_SITE_OTHER): Payer: BC Managed Care – PPO | Admitting: Family Medicine

## 2020-09-05 ENCOUNTER — Other Ambulatory Visit: Payer: Self-pay

## 2020-09-05 VITALS — BP 141/87 | HR 70 | Ht 74.0 in | Wt 228.0 lb

## 2020-09-05 DIAGNOSIS — M65811 Other synovitis and tenosynovitis, right shoulder: Secondary | ICD-10-CM | POA: Diagnosis not present

## 2020-09-05 NOTE — Assessment & Plan Note (Signed)
Has got improvement with the prednisone.  Previous uric acid was 4.4.  Unclear if this is related to another form of inflammatory process. -Counseled on home exercise therapy and supportive care. -CK, ANA, sed rate and CRP. -Could consider an injection.

## 2020-09-05 NOTE — Patient Instructions (Signed)
Good to see you Please continue the naproxen as needed  I will call with the results.   Please send me a message in MyChart with any questions or updates.  Please see me back in 4 weeks or as needed if better.   --Dr. Raeford Razor

## 2020-09-05 NOTE — Progress Notes (Signed)
Corey Nguyen - 54 y.o. male MRN [id number]  Date of birth: 1966/08/25  SUBJECTIVE:  Including CC & ROS.  Chief Complaint  Patient presents with  . Follow-up    right shoulder    Corey Nguyen is a 54 y.o. male that is presenting with improvement of his shoulder pain.  He is able to do most all of his normal activities.  Only has pain in significant flexion.   Review of Systems See HPI   HISTORY: Past Medical, Surgical, Social, and Family History Reviewed & Updated per EMR.   Pertinent Historical Findings include:  Past Medical History:  Diagnosis Date  . Chicken pox    childhood  . Hyperlipidemia    borderline  . Hypertension   . Hypertriglyceridemia 07/13/2017  . Idiopathic gout of knee   . Measles    childhood  . Mumps    childhood  . Sleep apnea    CPAP    Past Surgical History:  Procedure Laterality Date  . TESTICLE REMOVAL     Age 37-Undescended    Family History  Problem Relation Age of Onset  . Hypercholesterolemia Father        Living  . Hypercholesterolemia Brother   . Healthy Mother        Living  . Diabetes Maternal Grandfather        Insulin  . Heart defect Maternal Grandmother   . Alzheimer's disease Maternal Grandmother   . Prostate cancer Paternal Grandfather   . Healthy Paternal Grandmother        Living  . Healthy Paternal Uncle   . Diabetes Maternal Aunt   . Healthy Maternal Aunt   . Migraines Daughter   . Healthy Other        Siblings    Social History   Socioeconomic History  . Marital status: Widowed    Spouse name: Not on file  . Number of children: Not on file  . Years of education: Not on file  . Highest education level: Not on file  Occupational History  . Not on file  Tobacco Use  . Smoking status: Never Smoker  . Smokeless tobacco: Never Used  Vaping Use  . Vaping Use: Never used  Substance and Sexual Activity  . Alcohol use: Yes    Comment: Social  . Drug use: No  . Sexual activity: Not on file  Other  Topics Concern  . Not on file  Social History Narrative  . Not on file   Social Determinants of Health   Financial Resource Strain:   . Difficulty of Paying Living Expenses: Not on file  Food Insecurity:   . Worried About Charity fundraiser in the Last Year: Not on file  . Ran Out of Food in the Last Year: Not on file  Transportation Needs:   . Lack of Transportation (Medical): Not on file  . Lack of Transportation (Non-Medical): Not on file  Physical Activity:   . Days of Exercise per Week: Not on file  . Minutes of Exercise per Session: Not on file  Stress:   . Feeling of Stress : Not on file  Social Connections:   . Frequency of Communication with Friends and Family: Not on file  . Frequency of Social Gatherings with Friends and Family: Not on file  . Attends Religious Services: Not on file  . Active Member of Clubs or Organizations: Not on file  . Attends Archivist Meetings: Not on file  .  Marital Status: Not on file  Intimate Partner Violence:   . Fear of Current or Ex-Partner: Not on file  . Emotionally Abused: Not on file  . Physically Abused: Not on file  . Sexually Abused: Not on file     PHYSICAL EXAM:  VS: BP (!) 141/87   Pulse 70   Ht 6\' 2"  (1.88 m)   Wt 228 lb (103.4 kg)   BMI 29.27 kg/m  Physical Exam Gen: NAD, alert, cooperative with exam, well-appearing   ASSESSMENT & PLAN:   Synovitis of right shoulder Has got improvement with the prednisone.  Previous uric acid was 4.4.  Unclear if this is related to another form of inflammatory process. -Counseled on home exercise therapy and supportive care. -CK, ANA, sed rate and CRP. -Could consider an injection.

## 2020-09-09 LAB — ANA,IFA RA DIAG PNL W/RFLX TIT/PATN
ANA Titer 1: POSITIVE — AB
Cyclic Citrullin Peptide Ab: 7 units (ref 0–19)
Rheumatoid fact SerPl-aCnc: 11.7 IU/mL (ref <14.0)

## 2020-09-09 LAB — SEDIMENTATION RATE: Sed Rate: 4 mm/hr (ref 0–30)

## 2020-09-09 LAB — FANA STAINING PATTERNS: Speckled Pattern: 1:80 {titer}

## 2020-09-09 LAB — C-REACTIVE PROTEIN: CRP: 1 mg/L (ref 0–10)

## 2020-09-09 LAB — CK: Total CK: 206 U/L (ref 41–331)

## 2020-09-15 ENCOUNTER — Telehealth: Payer: Self-pay | Admitting: Family Medicine

## 2020-09-15 NOTE — Telephone Encounter (Signed)
Informed of results. Can re-test in 3-6 months.   Rosemarie Ax, MD [facility] 09/15/2020, 8:54 AM

## 2020-10-07 ENCOUNTER — Other Ambulatory Visit: Payer: Self-pay | Admitting: Family Medicine

## 2020-10-07 MED ORDER — VALACYCLOVIR HCL 500 MG PO TABS
500.0000 mg | ORAL_TABLET | Freq: Every day | ORAL | 2 refills | Status: DC
Start: 2020-10-07 — End: 2021-09-24

## 2020-10-13 DIAGNOSIS — F4323 Adjustment disorder with mixed anxiety and depressed mood: Secondary | ICD-10-CM | POA: Diagnosis not present

## 2021-01-20 ENCOUNTER — Other Ambulatory Visit: Payer: Self-pay | Admitting: Family Medicine

## 2021-01-20 DIAGNOSIS — M1A9XX0 Chronic gout, unspecified, without tophus (tophi): Secondary | ICD-10-CM

## 2021-02-02 ENCOUNTER — Encounter: Payer: Self-pay | Admitting: Family Medicine

## 2021-02-02 ENCOUNTER — Other Ambulatory Visit: Payer: Self-pay

## 2021-02-02 ENCOUNTER — Ambulatory Visit (INDEPENDENT_AMBULATORY_CARE_PROVIDER_SITE_OTHER): Payer: BC Managed Care – PPO | Admitting: Family Medicine

## 2021-02-02 VITALS — BP 120/70 | HR 57 | Temp 98.6°F | Ht 74.0 in | Wt 242.0 lb

## 2021-02-02 DIAGNOSIS — E781 Pure hyperglyceridemia: Secondary | ICD-10-CM | POA: Diagnosis not present

## 2021-02-02 DIAGNOSIS — B001 Herpesviral vesicular dermatitis: Secondary | ICD-10-CM

## 2021-02-02 DIAGNOSIS — M1A9XX0 Chronic gout, unspecified, without tophus (tophi): Secondary | ICD-10-CM | POA: Diagnosis not present

## 2021-02-02 LAB — COMPREHENSIVE METABOLIC PANEL
ALT: 24 U/L (ref 0–53)
AST: 26 U/L (ref 0–37)
Albumin: 4.5 g/dL (ref 3.5–5.2)
Alkaline Phosphatase: 88 U/L (ref 39–117)
BUN: 20 mg/dL (ref 6–23)
CO2: 30 mEq/L (ref 19–32)
Calcium: 9.4 mg/dL (ref 8.4–10.5)
Chloride: 102 mEq/L (ref 96–112)
Creatinine, Ser: 1.16 mg/dL (ref 0.40–1.50)
GFR: 70.32 mL/min (ref >60.00)
Glucose, Bld: 86 mg/dL (ref 70–99)
Potassium: 4.6 mEq/L (ref 3.5–5.1)
Sodium: 139 mEq/L (ref 135–145)
Total Bilirubin: 0.8 mg/dL (ref 0.2–1.2)
Total Protein: 7 g/dL (ref 6.0–8.3)

## 2021-02-02 LAB — LIPID PANEL
Cholesterol: 142 mg/dL (ref 0–200)
HDL: 40.8 mg/dL (ref >39.00)
LDL Cholesterol: 76 mg/dL (ref 0–99)
NonHDL: 100.97
Total CHOL/HDL Ratio: 3
Triglycerides: 123 mg/dL (ref 0.0–149.0)
VLDL: 24.6 mg/dL (ref 0.0–40.0)

## 2021-02-02 LAB — URIC ACID: Uric Acid, Serum: 4.7 mg/dL (ref 4.0–7.8)

## 2021-02-02 NOTE — Progress Notes (Signed)
p 

## 2021-02-02 NOTE — Progress Notes (Signed)
Chief Complaint  Patient presents with  . Follow-up    Corey Nguyen is a 55 y.o. male here for f/u.  Currently being treated with Allopurinol 300 mg/d. The joint(s) affected include: shoulder, knee, feet Most recent uric acid level is: 4.8 Reports compliance. Side effects of medications: None Is avoiding seafood, sweet/sugary beverages, alcohol, and red meats.  Cold Sores Patient has a history of recurrent cold sores.  He started taking Valtrex 500 mg daily almost a year ago and reports no outbreaks since then.  He is tolerating the medicine well and reports compliance.  Hyperlipidemia Patient presents for dyslipidemia follow up. Currently diet controlled. He denies myalgias. He is adhering to a healthy diet. Exercise: cardio, wt resistance exercise The patient is not known to have coexisting coronary artery disease.  Past Medical History:  Diagnosis Date  . Chicken pox    childhood  . Hyperlipidemia    borderline  . Hypertension   . Hypertriglyceridemia 07/13/2017  . Idiopathic gout of knee   . Measles    childhood  . Mumps    childhood  . Sleep apnea    CPAP    BP 120/70 (BP Location: Left Arm, Patient Position: Sitting, Cuff Size: Normal)   Pulse (!) 57   Temp 98.6 F (37 C) (Oral)   Ht 6\' 2"  (1.88 m)   Wt 242 lb (109.8 kg)   SpO2 99%   BMI 31.07 kg/m  Gen: Awake, alert, appears stated age Neck: No masses or asymmetry Heart: RRR, no murmurs, no bruits, no LE edema Lungs: CTAB, no accessory muscle use MSK: no swelling or TTP, normal gait Skin: No erythema or warmth Psych: Age appropriate judgment and insight, nml mood and affect  Chronic gout involving toe of right foot without tophus, unspecified cause  Recurrent cold sores  Hypertriglyceridemia  1. Chronic, controlled. Cont allopurinol 300 mg/d. Ck urate today. Reminded to avoid foods like alcohol, sweet beverages, red meat, lunch meat, sea food. 2.  Chronic, controlled.  Cont Valtrex 500 mg/d.   3.  Chronic, controlled.  Doing well off of meds.  F/u in 6 mo.  The patient voiced understanding and agreement to the plan.  [place], DO 02/02/21 7:50 AM

## 2021-02-02 NOTE — Patient Instructions (Signed)
Give us 2-3 business days to get the results of your labs back.   Keep the diet clean and stay active.  Let us know if you need anything. 

## 2021-02-13 DIAGNOSIS — F4323 Adjustment disorder with mixed anxiety and depressed mood: Secondary | ICD-10-CM | POA: Diagnosis not present

## 2021-03-30 DIAGNOSIS — F4323 Adjustment disorder with mixed anxiety and depressed mood: Secondary | ICD-10-CM | POA: Diagnosis not present

## 2021-05-08 DIAGNOSIS — F4323 Adjustment disorder with mixed anxiety and depressed mood: Secondary | ICD-10-CM | POA: Diagnosis not present

## 2021-05-11 ENCOUNTER — Other Ambulatory Visit: Payer: Self-pay

## 2021-05-11 ENCOUNTER — Encounter: Payer: Self-pay | Admitting: Family Medicine

## 2021-05-11 ENCOUNTER — Ambulatory Visit (HOSPITAL_BASED_OUTPATIENT_CLINIC_OR_DEPARTMENT_OTHER)
Admission: RE | Admit: 2021-05-11 | Discharge: 2021-05-11 | Disposition: A | Discharge status: Home / Self Care | Payer: BC Managed Care – PPO | Source: Ambulatory Visit | Attending: Family Medicine | Admitting: Family Medicine

## 2021-05-11 ENCOUNTER — Ambulatory Visit (INDEPENDENT_AMBULATORY_CARE_PROVIDER_SITE_OTHER): Payer: BC Managed Care – PPO | Admitting: Family Medicine

## 2021-05-11 VITALS — BP 128/82 | Ht 74.0 in | Wt 222.0 lb

## 2021-05-11 DIAGNOSIS — M65811 Other synovitis and tenosynovitis, right shoulder: Secondary | ICD-10-CM | POA: Diagnosis not present

## 2021-05-11 DIAGNOSIS — M19011 Primary osteoarthritis, right shoulder: Secondary | ICD-10-CM | POA: Diagnosis not present

## 2021-05-11 MED ORDER — PREDNISONE 20 MG PO TABS
ORAL_TABLET | ORAL | 0 refills | Status: DC
Start: 2021-05-11 — End: 2021-08-05

## 2021-05-11 NOTE — Patient Instructions (Signed)
Good to see you Please try the prednisone  Please get the xray and I'll call with the results.  I will call with the lab results.   Please send me a message in MyChart with any questions or updates.  Please see me back in 4 weeks.   --Dr. Raeford Razor

## 2021-05-11 NOTE — Progress Notes (Signed)
VEDH BRITO - 55 y.o. male MRN [id number]  Date of birth: 08-10-1966  SUBJECTIVE:  Including CC & ROS.  No chief complaint on file.   Kuba JADIAH GENOVESE is a 55 y.o. male that is presenting with worsening of his right shoulder pain.  The pain never is completely gone away.  Still lacks range of motion.  Continues to take his allopurinol and has had episodes of gout in his toes..    Review of Systems See HPI   HISTORY: Past Medical, Surgical, Social, and Family History Reviewed & Updated per EMR.   Pertinent Historical Findings include:  Past Medical History:  Diagnosis Date   Chicken pox    childhood   Hyperlipidemia    borderline   Hypertension    Hypertriglyceridemia 07/13/2017   Idiopathic gout of knee    Measles    childhood   Mumps    childhood   Sleep apnea    CPAP    Past Surgical History:  Procedure Laterality Date   TESTICLE REMOVAL     Age 31-Undescended    Family History  Problem Relation Age of Onset   Hypercholesterolemia Father        Living   Hypercholesterolemia Brother    Healthy Mother        Living   Diabetes Maternal Grandfather        Insulin   Heart defect Maternal Grandmother    Alzheimer's disease Maternal Grandmother    Prostate cancer Paternal Grandfather    Healthy Paternal Grandmother        Living   Healthy Paternal Uncle    Diabetes Maternal Aunt    Healthy Maternal Aunt    Migraines Daughter    Healthy Other        Siblings    Social History   Socioeconomic History   Marital status: Widowed    Spouse name: Not on file   Number of children: Not on file   Years of education: Not on file   Highest education level: Not on file  Occupational History   Not on file  Tobacco Use   Smoking status: Never   Smokeless tobacco: Never  Vaping Use   Vaping Use: Never used  Substance and Sexual Activity   Alcohol use: Yes    Comment: Social   Drug use: No   Sexual activity: Not on file  Other Topics Concern   Not on  file  Social History Narrative   Not on file   Social Determinants of Health   Financial Resource Strain: Not on file  Food Insecurity: Not on file  Transportation Needs: Not on file  Physical Activity: Not on file  Stress: Not on file  Social Connections: Not on file  Intimate Partner Violence: Not on file     PHYSICAL EXAM:  VS: BP 128/82 (BP Location: Left Arm, Patient Position: Sitting, Cuff Size: Large)   Ht '6\' 2"'$  (1.88 m)   Wt 222 lb (100.7 kg)   BMI 28.50 kg/m  Physical Exam Gen: NAD, alert, cooperative with exam, well-appearing MSK:  Right shoulder: Limited external rotation. Limited external rotation abduction. Normal strength resistance. Neurovascular intact     ASSESSMENT & PLAN:   Synovitis of right shoulder Acute on chronic in nature.  Acutely exacerbated and unclear if completely associated with inflammatory changes.  Does lack external rotation so seems to have a component of capsulitis as well. -Counseled on home exercise therapy and supportive care. -Prednisone. -X-ray. -ANA panel, sed  rate and CRP

## 2021-05-12 ENCOUNTER — Telehealth: Payer: Self-pay | Admitting: Family Medicine

## 2021-05-12 NOTE — Assessment & Plan Note (Addendum)
Acute on chronic in nature.  Acutely exacerbated and unclear if completely associated with inflammatory changes.  Does lack external rotation so seems to have a component of capsulitis as well. -Counseled on home exercise therapy and supportive care. -Prednisone. -X-ray. -ANA panel, sed rate and CRP

## 2021-05-12 NOTE — Telephone Encounter (Signed)
Informed of results.   Rosemarie Ax, MD [facility] 05/12/2021, 9:52 AM

## 2021-05-16 LAB — ANA,IFA RA DIAG PNL W/RFLX TIT/PATN
ANA Titer 1: NEGATIVE
Cyclic Citrullin Peptide Ab: 8 units (ref 0–19)
Rheumatoid fact SerPl-aCnc: 10.6 IU/mL (ref <14.0)

## 2021-05-16 LAB — C-REACTIVE PROTEIN: CRP: 2 mg/L (ref 0–10)

## 2021-05-16 LAB — SEDIMENTATION RATE: Sed Rate: 3 mm/hr (ref 0–30)

## 2021-05-18 ENCOUNTER — Telehealth: Payer: Self-pay | Admitting: Family Medicine

## 2021-05-18 NOTE — Telephone Encounter (Signed)
Informed of results.   Rosemarie Ax, MD [facility] 05/18/2021, 9:41 AM

## 2021-06-05 DIAGNOSIS — F4323 Adjustment disorder with mixed anxiety and depressed mood: Secondary | ICD-10-CM | POA: Diagnosis not present

## 2021-06-12 ENCOUNTER — Ambulatory Visit (INDEPENDENT_AMBULATORY_CARE_PROVIDER_SITE_OTHER): Payer: BC Managed Care – PPO | Admitting: Family Medicine

## 2021-06-12 ENCOUNTER — Other Ambulatory Visit: Payer: Self-pay

## 2021-06-12 ENCOUNTER — Encounter: Payer: Self-pay | Admitting: Family Medicine

## 2021-06-12 VITALS — Ht 74.0 in | Wt 222.0 lb

## 2021-06-12 DIAGNOSIS — M65811 Other synovitis and tenosynovitis, right shoulder: Secondary | ICD-10-CM | POA: Diagnosis not present

## 2021-06-12 MED ORDER — COLCHICINE 0.6 MG PO TABS: 0.6000 mg | ORAL_TABLET | Freq: Two times a day (BID) | ORAL | 2 refills | Status: DC

## 2021-06-12 NOTE — Progress Notes (Signed)
  Corey Nguyen - 55 y.o. male MRN [id number]  Date of birth: 03-20-1966  SUBJECTIVE:  Including CC & ROS.  No chief complaint on file.   Corey Nguyen is a 55 y.o. male that is following up for his right shoulder pain.  Today he feels no pain whatsoever.  He does have worsening of his pain after he is eating more tuna than normal.   Review of Systems See HPI   HISTORY: Past Medical, Surgical, Social, and Family History Reviewed & Updated per EMR.   Pertinent Historical Findings include:  Past Medical History:  Diagnosis Date   Chicken pox    childhood   Hyperlipidemia    borderline   Hypertension    Hypertriglyceridemia 07/13/2017   Idiopathic gout of knee    Measles    childhood   Mumps    childhood   Sleep apnea    CPAP    Past Surgical History:  Procedure Laterality Date   TESTICLE REMOVAL     Age 27-Undescended    Family History  Problem Relation Age of Onset   Hypercholesterolemia Father        Living   Hypercholesterolemia Brother    Healthy Mother        Living   Diabetes Maternal Grandfather        Insulin   Heart defect Maternal Grandmother    Alzheimer's disease Maternal Grandmother    Prostate cancer Paternal Grandfather    Healthy Paternal Grandmother        Living   Healthy Paternal Uncle    Diabetes Maternal Aunt    Healthy Maternal Aunt    Migraines Daughter    Healthy Other        Siblings    Social History   Socioeconomic History   Marital status: Widowed    Spouse name: Not on file   Number of children: Not on file   Years of education: Not on file   Highest education level: Not on file  Occupational History   Not on file  Tobacco Use   Smoking status: Never   Smokeless tobacco: Never  Vaping Use   Vaping Use: Never used  Substance and Sexual Activity   Alcohol use: Yes    Comment: Social   Drug use: No   Sexual activity: Not on file  Other Topics Concern   Not on file  Social History Narrative   Not on file    Social Determinants of Health   Financial Resource Strain: Not on file  Food Insecurity: Not on file  Transportation Needs: Not on file  Physical Activity: Not on file  Stress: Not on file  Social Connections: Not on file  Intimate Partner Violence: Not on file     PHYSICAL EXAM:  VS: Ht '6\' 2"'$  (1.88 m)   Wt 222 lb (100.7 kg)   BMI 28.50 kg/m  Physical Exam Gen: NAD, alert, cooperative with exam, well-appearing      ASSESSMENT & PLAN:   Synovitis of right shoulder No significant pain today and has good range of motion.  Symptoms consistent with inflammatory changes.  He did not notice more tightness when he was eating more tuna in his diet.  -Counseled on home exercise therapy and supportive care. -Colchicine. -Could consider injection or further imaging.

## 2021-06-12 NOTE — Patient Instructions (Signed)
Good to see you You can use the colchicine if your symptoms start to present themselves   Please send me a message in MyChart with any questions or updates.  Please see me back as needed.   --Dr. Raeford Razor

## 2021-06-12 NOTE — Assessment & Plan Note (Signed)
No significant pain today and has good range of motion.  Symptoms consistent with inflammatory changes.  He did not notice more tightness when he was eating more tuna in his diet.  -Counseled on home exercise therapy and supportive care. -Colchicine. -Could consider injection or further imaging.

## 2021-06-26 DIAGNOSIS — G4733 Obstructive sleep apnea (adult) (pediatric): Secondary | ICD-10-CM | POA: Diagnosis not present

## 2021-07-09 DIAGNOSIS — F4323 Adjustment disorder with mixed anxiety and depressed mood: Secondary | ICD-10-CM | POA: Diagnosis not present

## 2021-07-14 ENCOUNTER — Encounter: Payer: Self-pay | Admitting: Family Medicine

## 2021-07-15 ENCOUNTER — Ambulatory Visit: Payer: Self-pay

## 2021-07-15 ENCOUNTER — Encounter: Payer: Self-pay | Admitting: Family Medicine

## 2021-07-15 ENCOUNTER — Ambulatory Visit (INDEPENDENT_AMBULATORY_CARE_PROVIDER_SITE_OTHER): Payer: BC Managed Care – PPO | Admitting: Family Medicine

## 2021-07-15 VITALS — Ht 74.0 in | Wt 222.0 lb

## 2021-07-15 DIAGNOSIS — M65811 Other synovitis and tenosynovitis, right shoulder: Secondary | ICD-10-CM | POA: Diagnosis not present

## 2021-07-15 MED ORDER — TRIAMCINOLONE ACETONIDE 40 MG/ML IJ SUSP: 40.0000 mg | Freq: Once | INTRAMUSCULAR | Status: DC

## 2021-07-15 NOTE — Progress Notes (Signed)
PER BEAGLEY - 55 y.o. male MRN [id number]  Date of birth: 09-19-1966  SUBJECTIVE:  Including CC & ROS.  No chief complaint on file.   Corey Nguyen is a 55 y.o. male that is presenting with acute worsening of his left shoulder pain.  No injury or inciting event.  He has tried colchicine.  Still having some stiffness and a deep ache at the joint.   Review of Systems See HPI   HISTORY: Past Medical, Surgical, Social, and Family History Reviewed & Updated per EMR.   Pertinent Historical Findings include:  Past Medical History:  Diagnosis Date   Chicken pox    childhood   Hyperlipidemia    borderline   Hypertension    Hypertriglyceridemia 07/13/2017   Idiopathic gout of knee    Measles    childhood   Mumps    childhood   Sleep apnea    CPAP    Past Surgical History:  Procedure Laterality Date   TESTICLE REMOVAL     Age 12-Undescended    Family History  Problem Relation Age of Onset   Hypercholesterolemia Father        Living   Hypercholesterolemia Brother    Healthy Mother        Living   Diabetes Maternal Grandfather        Insulin   Heart defect Maternal Grandmother    Alzheimer's disease Maternal Grandmother    Prostate cancer Paternal Grandfather    Healthy Paternal Grandmother        Living   Healthy Paternal Uncle    Diabetes Maternal Aunt    Healthy Maternal Aunt    Migraines Daughter    Healthy Other        Siblings    Social History   Socioeconomic History   Marital status: Widowed    Spouse name: Not on file   Number of children: Not on file   Years of education: Not on file   Highest education level: Not on file  Occupational History   Not on file  Tobacco Use   Smoking status: Never   Smokeless tobacco: Never  Vaping Use   Vaping Use: Never used  Substance and Sexual Activity   Alcohol use: Yes    Comment: Social   Drug use: No   Sexual activity: Not on file  Other Topics Concern   Not on file  Social History Narrative    Not on file   Social Determinants of Health   Financial Resource Strain: Not on file  Food Insecurity: Not on file  Transportation Needs: Not on file  Physical Activity: Not on file  Stress: Not on file  Social Connections: Not on file  Intimate Partner Violence: Not on file     PHYSICAL EXAM:  VS: Ht 6\' 2"  (1.88 m)   Wt 222 lb (100.7 kg)   BMI 28.50 kg/m  Physical Exam Gen: NAD, alert, cooperative with exam, well-appearing    Aspiration/Injection Procedure Note Corey Nguyen 05/17/1966  Procedure: Injection Indications: Right shoulder pain  Procedure Details Consent: Risks of procedure as well as the alternatives and risks of each were explained to the (patient/caregiver).  Consent for procedure obtained. Time Out: Verified patient identification, verified procedure, site/side was marked, verified correct patient position, special equipment/implants available, medications/allergies/relevent history reviewed, required imaging and test results available.  Performed.  The area was cleaned with iodine and alcohol swabs.    The right glenohumeral joint was injected using 3 cc  of 1% lidocaine on a 22-gauge 3-1/2 inch needle.  The syringe was switched to mixture containing 1 cc's of 40 mg Kenalog and 4 cc's of 0.25% bupivacaine was injected.  Ultrasound was used. Images were obtained in short views showing the injection.     A sterile dressing was applied.  Patient did tolerate procedure well.      ASSESSMENT & PLAN:   Synovitis of right shoulder Acute on chronic in nature.  Has tried colchicine with some improvement in his foot but still lingering in his shoulder. -Counseled on home exercise therapy and supportive care. -Injection today. -Could consider further imaging.

## 2021-07-15 NOTE — Assessment & Plan Note (Signed)
Acute on chronic in nature.  Has tried colchicine with some improvement in his foot but still lingering in his shoulder. -Counseled on home exercise therapy and supportive care. -Injection today. -Could consider further imaging.

## 2021-07-15 NOTE — Patient Instructions (Signed)
Good to see you Please use ice as needed   Please send me a message in MyChart with any questions or updates.  Please see me back as needed if better.   --Dr. Raeford Razor

## 2021-08-04 ENCOUNTER — Other Ambulatory Visit: Payer: Self-pay

## 2021-08-05 ENCOUNTER — Encounter: Payer: Self-pay | Admitting: Family Medicine

## 2021-08-05 ENCOUNTER — Ambulatory Visit (INDEPENDENT_AMBULATORY_CARE_PROVIDER_SITE_OTHER): Payer: BC Managed Care – PPO | Admitting: Family Medicine

## 2021-08-05 VITALS — BP 120/80 | HR 57 | Temp 98.0°F | Ht 74.0 in | Wt 232.1 lb

## 2021-08-05 DIAGNOSIS — Z0000 Encounter for general adult medical examination without abnormal findings: Secondary | ICD-10-CM

## 2021-08-05 LAB — COMPREHENSIVE METABOLIC PANEL
ALT: 22 U/L (ref 0–53)
AST: 20 U/L (ref 0–37)
Albumin: 4.5 g/dL (ref 3.5–5.2)
Alkaline Phosphatase: 81 U/L (ref 39–117)
BUN: 27 mg/dL — ABNORMAL HIGH (ref 6–23)
CO2: 30 mEq/L (ref 19–32)
Calcium: 9.3 mg/dL (ref 8.4–10.5)
Chloride: 101 mEq/L (ref 96–112)
Creatinine, Ser: 1.12 mg/dL (ref 0.40–1.50)
GFR: 73.09 mL/min (ref >60.00)
Glucose, Bld: 76 mg/dL (ref 70–99)
Potassium: 4.3 mEq/L (ref 3.5–5.1)
Sodium: 138 mEq/L (ref 135–145)
Total Bilirubin: 1 mg/dL (ref 0.2–1.2)
Total Protein: 7 g/dL (ref 6.0–8.3)

## 2021-08-05 LAB — CBC
HCT: 47.3 % (ref 39.0–52.0)
Hemoglobin: 16.2 g/dL (ref 13.0–17.0)
MCHC: 34.3 g/dL (ref 30.0–36.0)
MCV: 85.3 fl (ref 78.0–100.0)
Platelets: 161 10*3/uL (ref 150.0–400.0)
RBC: 5.54 Mil/uL (ref 4.22–5.81)
RDW: 12.5 % (ref 11.5–15.5)
WBC: 4.5 10*3/uL (ref 4.0–10.5)

## 2021-08-05 LAB — URIC ACID: Uric Acid, Serum: 4.7 mg/dL (ref 4.0–7.8)

## 2021-08-05 LAB — LIPID PANEL
Cholesterol: 168 mg/dL (ref 0–200)
HDL: 48.2 mg/dL (ref >39.00)
LDL Cholesterol: 97 mg/dL (ref 0–99)
NonHDL: 119.99
Total CHOL/HDL Ratio: 3
Triglycerides: 114 mg/dL (ref 0.0–149.0)
VLDL: 22.8 mg/dL (ref 0.0–40.0)

## 2021-08-05 NOTE — Patient Instructions (Signed)
Give [place] 2-3 business days to get the results of your labs back.   Keep the diet clean and stay active.  I recommend getting the updated bivalent covid vaccination booster at your convenience.   The new Shingrix vaccine (for shingles) is a 2 shot series. It can make people feel low energy, achy and almost like they have the flu for 48 hours after injection. Please plan accordingly when deciding on when to get this shot. Call our office for a nurse visit appointment to get this. The second shot of the series is less severe regarding the side effects, but it still lasts 48 hours.   Let us know if you need anything.

## 2021-08-05 NOTE — Progress Notes (Signed)
Chief Complaint  Patient presents with   Annual Exam    Well Male Corey Nguyen is here for a complete physical.   His last physical was >1 year ago.  Current diet: in general, a "healthy" diet.  Current exercise: cardio, wt resistance exercise Weight trend: stable Fatigue out of ordinary? No. Seat belt? Yes.    Health maintenance Shingrix- No Colonoscopy- Yes Tetanus- Yes HIV- Yes Hep C- Yes   Past Medical History:  Diagnosis Date   Chicken pox    childhood   Hyperlipidemia    borderline   Hypertension    Hypertriglyceridemia 07/13/2017   Idiopathic gout of knee    Measles    childhood   Mumps    childhood   Sleep apnea    CPAP      Past Surgical History:  Procedure Laterality Date   TESTICLE REMOVAL     Age 55-Undescended    Medications  Current Outpatient Medications on File Prior to Visit  Medication Sig Dispense Refill   allopurinol (ZYLOPRIM) 100 MG tablet TAKE 2 TABLETS DAILY 180 tablet 2   colchicine 0.6 MG tablet Take 1 tablet (0.6 mg total) by mouth 2 (two) times daily. 60 tablet 2   Multiple Vitamin (MULTI VITAMIN MENS PO) Take 1 tablet by mouth daily.     Omega-3 Fatty Acids (FISH OIL) 1000 MG CAPS Take 1 capsule by mouth daily.     valACYclovir (VALTREX) 500 MG tablet Take 1 tablet (500 mg total) by mouth daily. 90 tablet 2   Wheat Dextrin (BENEFIBER DRINK MIX PO) Take by mouth. Take as directed.      Allergies No Known Allergies  Family History Family History  Problem Relation Age of Onset   Hypercholesterolemia Father        Living   Hypercholesterolemia Brother    Healthy Mother        Living   Diabetes Maternal Grandfather        Insulin   Heart defect Maternal Grandmother    Alzheimer's disease Maternal Grandmother    Prostate cancer Paternal Grandfather    Healthy Paternal Grandmother        Living   Healthy Paternal Uncle    Diabetes Maternal Aunt    Healthy Maternal 50    Migraines Daughter    Healthy Other         Siblings    Review of Systems: Constitutional:  no fevers Eye:  no recent significant change in vision Ear/Nose/Mouth/Throat:  Ears:  no hearing loss Nose/Mouth/Throat:  no complaints of nasal congestion, no sore throat Cardiovascular:  no chest pain Respiratory:  no shortness of breath Gastrointestinal:  no change in bowel habits GU:  Male: negative for dysuria, frequency Musculoskeletal/Extremities:  no joint pain Integumentary (Skin/Breast):  no abnormal skin lesions reported Neurologic:  no headaches Endocrine: No unexpected weight changes Hematologic/Lymphatic:  no abnormal bleeding  Exam BP 120/80   Pulse (!) 57   Temp 98 F (36.7 C) (Oral)   Ht 6\' 2"  (1.88 m)   Wt 232 lb 2 oz (105.3 kg)   SpO2 99%   BMI 29.80 kg/m  General:  well developed, well nourished, in no apparent distress Skin:  no significant moles, warts, or growths Head:  no masses, lesions, or tenderness Eyes:  pupils equal and round, sclera anicteric without injection Ears:  canals without lesions, TMs shiny without retraction, no obvious effusion, no erythema Nose:  nares patent, septum midline, mucosa normal Throat/Pharynx:  lips and  gingiva without lesion; tongue and uvula midline; non-inflamed pharynx; no exudates or postnasal drainage Neck: neck supple without adenopathy, thyromegaly, or masses Cardiac: RRR, no bruits, no LE edema Lungs:  clear to auscultation, breath sounds equal bilaterally, no respiratory distress Abdomen: BS+, soft, non-tender, non-distended, no masses or organomegaly noted Rectal: Deferred Musculoskeletal:  symmetrical muscle groups noted without atrophy or deformity Neuro:  gait normal; deep tendon reflexes normal and symmetric Psych: well oriented with normal range of affect and appropriate judgment/insight  Assessment and Plan  Well adult exam - Plan: Lipid panel, CBC, Comprehensive metabolic panel, Uric acid   Well 55 y.o. male. Counseled on diet and  exercise. Counseled on risks and benefits of prostate cancer screening with PSA. The patient agrees to forego testing. Shingrix and covid bivalent booster rec'd.  Immunizations, labs, and further orders as above. Follow up in 6 mo or prn. The patient voiced understanding and agreement to the plan.  [place], DO 08/05/21 7:27 AM

## 2021-08-07 DIAGNOSIS — F4323 Adjustment disorder with mixed anxiety and depressed mood: Secondary | ICD-10-CM | POA: Diagnosis not present

## 2021-08-09 ENCOUNTER — Other Ambulatory Visit: Payer: Self-pay | Admitting: Family Medicine

## 2021-08-09 DIAGNOSIS — M65811 Other synovitis and tenosynovitis, right shoulder: Secondary | ICD-10-CM

## 2021-09-11 DIAGNOSIS — F4323 Adjustment disorder with mixed anxiety and depressed mood: Secondary | ICD-10-CM | POA: Diagnosis not present

## 2021-09-24 ENCOUNTER — Other Ambulatory Visit: Payer: Self-pay | Admitting: Family Medicine

## 2021-10-07 ENCOUNTER — Other Ambulatory Visit: Payer: Self-pay | Admitting: Family Medicine

## 2021-10-07 DIAGNOSIS — M1A9XX0 Chronic gout, unspecified, without tophus (tophi): Secondary | ICD-10-CM

## 2021-10-16 DIAGNOSIS — F4323 Adjustment disorder with mixed anxiety and depressed mood: Secondary | ICD-10-CM | POA: Diagnosis not present

## 2021-11-05 ENCOUNTER — Other Ambulatory Visit: Payer: Self-pay | Admitting: Family Medicine

## 2021-11-05 DIAGNOSIS — M65811 Other synovitis and tenosynovitis, right shoulder: Secondary | ICD-10-CM

## 2021-11-25 ENCOUNTER — Encounter: Payer: Self-pay | Admitting: Family Medicine

## 2021-11-26 ENCOUNTER — Other Ambulatory Visit: Payer: Self-pay | Admitting: Family Medicine

## 2021-11-26 DIAGNOSIS — G4733 Obstructive sleep apnea (adult) (pediatric): Secondary | ICD-10-CM

## 2021-12-17 DIAGNOSIS — M2041 Other hammer toe(s) (acquired), right foot: Secondary | ICD-10-CM | POA: Diagnosis not present

## 2021-12-17 DIAGNOSIS — L84 Corns and callosities: Secondary | ICD-10-CM | POA: Diagnosis not present

## 2021-12-17 DIAGNOSIS — M2042 Other hammer toe(s) (acquired), left foot: Secondary | ICD-10-CM | POA: Diagnosis not present

## 2021-12-17 DIAGNOSIS — M79671 Pain in right foot: Secondary | ICD-10-CM | POA: Diagnosis not present

## 2021-12-17 DIAGNOSIS — M79672 Pain in left foot: Secondary | ICD-10-CM | POA: Diagnosis not present

## 2022-02-03 ENCOUNTER — Encounter: Payer: Self-pay | Admitting: Family Medicine

## 2022-02-03 ENCOUNTER — Ambulatory Visit (INDEPENDENT_AMBULATORY_CARE_PROVIDER_SITE_OTHER): Payer: BC Managed Care – PPO | Admitting: Family Medicine

## 2022-02-03 VITALS — BP 118/70 | HR 66 | Temp 97.9°F | Ht 74.0 in | Wt 232.0 lb

## 2022-02-03 DIAGNOSIS — B001 Herpesviral vesicular dermatitis: Secondary | ICD-10-CM

## 2022-02-03 DIAGNOSIS — M1A9XX0 Chronic gout, unspecified, without tophus (tophi): Secondary | ICD-10-CM

## 2022-02-03 DIAGNOSIS — Z9989 Dependence on other enabling machines and devices: Secondary | ICD-10-CM

## 2022-02-03 DIAGNOSIS — G4733 Obstructive sleep apnea (adult) (pediatric): Secondary | ICD-10-CM

## 2022-02-03 NOTE — Patient Instructions (Addendum)
Keep up the great work staying active and eating healthy.  ? ?Foods to AVOID: ?Red meat, organ meat (liver), lunch meat, seafood (mussels, scallops, anchovies, etc) ?Alcohol ?Sugary foods/beverages (diet soft drinks have no link to flares) ? ?Foods to migrate to: ?Dairy ?Vegetables ?Cherries have limited data to suggest they help lower uric acid levels (and prevent flares) ?Vit C (500 mg daily) may have a modest effect with preventing flares ?Poultry ?If you are going to eat red meat, beef and pork may give you less problems than lamb. ? ?Foods that may reduce pain: ?1) Ginger ?2) Blueberries ?3) Salmon ?4) Pumpkin seeds ?5) dark chocolate ?6) turmeric ?7) tart cherries ?8) virgin olive oil ?9) chilli peppers ?10) mint ?11) krill oil ? ?Let us know if you need anything. ?

## 2022-02-03 NOTE — Progress Notes (Signed)
Chief Complaint  ?Patient presents with  ? Follow-up  ?  6 month ?  ? ? ?Corey Nguyen is a 56 y.o. male here for gout. ? ?Currently being treated with Allopurinol 200 mg/d. ?The joint(s) affected include: R MTP ?Most recent uric acid level is: 4.7 ?Reports compliance. ?Cannot remember when his last flare was.  ?Side effects of medications: None ?Is regularly avoiding seafood, sweet/sugary beverages, alcohol, and red meats. ? ?Pt has recurrent cold sores for which he takes Valtrex 500 mg/d for. Has not had an outbreak since taking it daily.  Reports compliance, no AE's.  ? ?Past Medical History:  ?Diagnosis Date  ? Chicken pox   ? childhood  ? Hyperlipidemia   ? borderline  ? Hypertension   ? Hypertriglyceridemia 07/13/2017  ? Idiopathic gout of knee   ? Measles   ? childhood  ? Mumps   ? childhood  ? Sleep apnea   ? CPAP  ? ? ?BP 118/70   Pulse 66   Temp 97.9 ?F (36.6 ?C) (Oral)   Ht '6\' 2"'$  (1.88 m)   Wt 232 lb (105.2 kg)   SpO2 95%   BMI 29.79 kg/m?  ?Gen: Awake, alert, appears stated age ?Neck: No masses or asymmetry ?Heart: RRR, no murmurs, no bruits, no LE edema ?Lungs: CTAB, no accessory muscle use ?MSK: no swelling or TTP, normal gait ?Skin: No erythema or warmth ?Psych: Age appropriate judgment and insight, nml mood and affect ? ?Chronic gout involving toe of right foot without tophus, unspecified cause ? ?Recurrent cold sores ? ?Chronic, stable. Cont allopurinol 200 mg/d. Reminded to avoid foods like alcohol, sweet beverages, red meat, lunch meat, sea food. ?Chronic, stable. Cont Valtrex 500 mg/d.  ?F/u in 6 mo for CPE; after that will see him yearly for CPE+OV. ?The patient voiced understanding and agreement to the plan. ? ?Shelda Pal, DO ?02/03/22 ?7:23 AM ?\ ?

## 2022-03-04 ENCOUNTER — Encounter: Payer: Self-pay | Admitting: Primary Care

## 2022-03-04 ENCOUNTER — Ambulatory Visit (INDEPENDENT_AMBULATORY_CARE_PROVIDER_SITE_OTHER): Payer: BC Managed Care – PPO | Admitting: Primary Care

## 2022-03-04 VITALS — BP 124/76 | HR 68 | Temp 98.2°F | Ht 74.0 in | Wt 230.0 lb

## 2022-03-04 DIAGNOSIS — G4733 Obstructive sleep apnea (adult) (pediatric): Secondary | ICD-10-CM | POA: Diagnosis not present

## 2022-03-04 NOTE — Patient Instructions (Addendum)
Recommendations - Continue to wear CPAP every night for minimum 4 to 6 hours or longer - Do not drive experiencing excessive daytime sleepiness or fatigue - Maintain normal BMI - We will place an order for you to receive a new CPAP machine, if you have not heard from DME company in  6 weeks please reach out to our office to follow-up on order  Orders: - DME order for new CPAP machine auto titrate 5-15cm h20, mask of choice, humidification, supplies, enroll in [place]   Follow-up: - 6 months with Beth NP  CPAP and BIPAP Information CPAP and BIPAP are methods that use air pressure to keep your airways open and to help you breathe well. CPAP and BIPAP use different amounts of pressure. Your health care provider will tell you whether CPAP or BIPAP would be more helpful for you. CPAP stands for "continuous positive airway pressure." With CPAP, the amount of pressure stays the same while you breathe in (inhale) and out (exhale). BIPAP stands for "bi-level positive airway pressure." With BIPAP, the amount of pressure will be higher when you inhale and lower when you exhale. This allows you to take larger breaths. CPAP or BIPAP may be used in the hospital, or your health care provider may want you to use it at home. You may need to have a sleep study before your health care provider can order a machine for you to use at home. What are the advantages? CPAP or BIPAP can be helpful if you have: Sleep apnea. Chronic obstructive pulmonary disease (COPD). Heart failure. Medical conditions that cause muscle weakness, including muscular dystrophy or amyotrophic lateral sclerosis (ALS). Other problems that cause breathing to be shallow, weak, abnormal, or difficult. CPAP and BIPAP are most commonly used for obstructive sleep apnea (OSA) to keep the airways from collapsing when the muscles relax during sleep. What are the risks? Generally, this is a safe treatment. However, problems may occur,  including: Irritated skin or skin sores if the mask does not fit properly. Dry or stuffy nose or nosebleeds. Dry mouth. Feeling gassy or bloated. Sinus or lung infection if the equipment is not cleaned properly. When should CPAP or BIPAP be used? In most cases, the mask only needs to be worn during sleep. Generally, the mask needs to be worn throughout the night and during any daytime naps. People with certain medical conditions may also need to wear the mask at other times, such as when they are awake. Follow instructions from your health care provider about when to use the machine. What happens during CPAP or BIPAP?  Both CPAP and BIPAP are provided by a small machine with a flexible plastic tube that attaches to a plastic mask that you wear. Air is blown through the mask into your nose or mouth. The amount of pressure that is used to blow the air can be adjusted on the machine. Your health care provider will set the pressure setting and help you find the best mask for you. Tips for using the mask Because the mask needs to be snug, some people feel trapped or closed-in (claustrophobic) when first using the mask. If you feel this way, you may need to get used to the mask. One way to do this is to hold the mask loosely over your nose or mouth and then gradually apply the mask more snugly. You can also gradually increase the amount of time that you use the mask. Masks are available in various types and sizes. If your  mask does not fit well, talk with your health care provider about getting a different one. Some common types of masks include: Full face masks, which fit over the mouth and nose. Nasal masks, which fit over the nose. Nasal pillow or prong masks, which fit into the nostrils. If you are using a mask that fits over your nose and you tend to breathe through your mouth, a chin strap may be applied to help keep your mouth closed. Use a skin barrier to protect your skin as told by your health  care provider. Some CPAP and BIPAP machines have alarms that may sound if the mask comes off or develops a leak. If you have trouble with the mask, it is very important that you talk with your health care provider about finding a way to make the mask easier to tolerate. Do not stop using the mask. There could be a negative impact on your health if you stop using the mask. Tips for using the machine Place your CPAP or BIPAP machine on a secure table or stand near an electrical outlet. Know where the on/off switch is on the machine. Follow instructions from your health care provider about how to set the pressure on your machine and when you should use it. Do not eat or drink while the CPAP or BIPAP machine is on. Food or fluids could get pushed into your lungs by the pressure of the CPAP or BIPAP. For home use, CPAP and BIPAP machines can be rented or purchased through home health care companies. Many different brands of machines are available. Renting a machine before purchasing may help you find out which particular machine works well for you. Your health insurance company may also decide which machine you may get. Keep the CPAP or BIPAP machine and attachments clean. Ask your health care provider for specific instructions. Check the humidifier if you have a dry stuffy nose or nosebleeds. Make sure it is working correctly. Follow these instructions at home: Take over-the-counter and prescription medicines only as told by your health care provider. Ask if you can take sinus medicine if your sinuses are blocked. Do not use any products that contain nicotine or tobacco. These products include cigarettes, chewing tobacco, and vaping devices, such as e-cigarettes. If you need help quitting, ask your health care provider. Keep all follow-up visits. This is important. Contact a health care provider if: You have redness or pressure sores on your head, face, mouth, or nose from the mask or head gear. You  have trouble using the CPAP or BIPAP machine. You cannot tolerate wearing the CPAP or BIPAP mask. Someone tells you that you snore even when wearing your CPAP or BIPAP. Get help right away if: You have trouble breathing. You feel confused. Summary CPAP and BIPAP are methods that use air pressure to keep your airways open and to help you breathe well. If you have trouble with the mask, it is very important that you talk with your health care provider about finding a way to make the mask easier to tolerate. Do not stop using the mask. There could be a negative impact to your health if you stop using the mask. Follow instructions from your health care provider about when to use the machine. This information is not intended to replace advice given to you by your health care provider. Make sure you discuss any questions you have with your health care provider. Document Revised: 04/29/2021 Document Reviewed: 08/29/2020 Elsevier Patient Education  2023 Elsevier  Inc.

## 2022-03-04 NOTE — Progress Notes (Signed)
$'@Patient'v$  ID: Corey Nguyen, male    DOB: 12-Apr-1966, 56 y.o.   MRN: [id number]  Chief Complaint  Patient presents with   Consult    Pt is here for sleep consult. Pt states last sleep study was at home about 7 years ago. Pt is currently on cpap therapy. Is wanting a new machine since his is 56 years old and is messing up.     Referring provider: Shelda Pal*  HPI: 56 year old male, never smoked.  Past medical history significant for gout, obesity and OSA on CPAP.  03/04/2022 Patient presents today for sleep consult. Patient has a history of moderate obstructive sleep apnea.  He had a sleep study on 09/18/2014 that showed moderate obstructive sleep apnea, AHI 16.8/h with SPO2 low 86%.  He is on CPAP. He has had machine for 7 years. Button on his machine is broken but still useable.  He has lost between 35 and 40 pounds since his last sleep study in 2015.  He continues to follow-uo with trainer/nutritionist.  Despite weight loss he still continues to have symptoms of snoring and daytime sleepiness.  Daytime sleepiness improved with CPAP use.  He is 80% compliant with CPAP greater than 4 hours.  Current pressure auto titrate 5 to 20 cm H2O.  [facility] download 12/04/2021 - 03/03/2022 Usage 73/90 days (81%); 72 days (80%) greater than 4 hours Average usage 5 hours 29 minutes Pressure 5 to 20 cm H2O (10.6 cm H2O-95%) Air leaks 31.8 L/min (95%) AHI 1.3  Sleep questionnaire Symptoms-  Hx sleep apnea, snoring  Prior sleep study- 09/18/2014, moderate OSA with AHI 16/hr Bedtime- 10-11:30pm Time to fall asleep- 15-80mn  Nocturnal awakenings- 1-2 times Out of bed/start of day- 5:30-6:30am Weight changes- down 35-40 lbs since 2015 Do you operate heavy machinery- No Do you currently wear CPAP- Yes, auto 5-20cm h20 Do you current wear oxygen- No [facility]- 6   No Known Allergies  Immunization History  Administered Date(s) Administered   PFIZER(Purple Top)SARS-COV-2 Vaccination  12/18/2019, 01/14/2020   Tdap 09/08/2015    Past Medical History:  Diagnosis Date   Chicken pox    childhood   Hyperlipidemia    borderline   Hypertension    Hypertriglyceridemia 07/13/2017   Idiopathic gout of knee    Measles    childhood   Mumps    childhood   Sleep apnea    CPAP    Tobacco History: Social History   Tobacco Use  Smoking Status Never  Smokeless Tobacco Never   Counseling given: Not Answered   Outpatient Medications Prior to Visit  Medication Sig Dispense Refill   allopurinol (ZYLOPRIM) 100 MG tablet TAKE 2 TABLETS DAILY 180 tablet 2   colchicine 0.6 MG tablet TAKE 1 TABLET BY MOUTH TWICE A DAY 180 tablet 1   Multiple Vitamin (MULTI VITAMIN MENS PO) Take 1 tablet by mouth daily.     Omega-3 Fatty Acids (FISH OIL) 1000 MG CAPS Take 1 capsule by mouth daily.     valACYclovir (VALTREX) 500 MG tablet TAKE 1 TABLET DAILY 90 tablet 2   Wheat Dextrin (BENEFIBER DRINK MIX PO) Take by mouth. Take as directed.     Facility-Administered Medications Prior to Visit  Medication Dose Route Frequency Provider Last Rate Last Admin   triamcinolone acetonide (KENALOG-40) injection 40 mg  40 mg Intra-articular Once SRosemarie Ax MD        Review of Systems  Review of Systems  Constitutional: Negative.   HENT: Negative.  Respiratory: Negative.      Physical Exam  BP 124/76 (BP Location: Right Arm, Patient Position: Sitting, Cuff Size: Normal)   Pulse 68   Temp 98.2 F (36.8 C) (Oral)   Ht '6\' 2"'$  (1.88 m)   Wt 230 lb (104.3 kg)   SpO2 99%   BMI 29.53 kg/m  Physical Exam Constitutional:      Appearance: Normal appearance.  HENT:     Head: Normocephalic and atraumatic.  Cardiovascular:     Rate and Rhythm: Normal rate and regular rhythm.  Pulmonary:     Effort: Pulmonary effort is normal.     Breath sounds: Normal breath sounds.  Musculoskeletal:        General: Normal range of motion.  Skin:    General: Skin is warm and dry.  Neurological:      General: No focal deficit present.     Mental Status: He is alert and oriented to person, place, and time. Mental status is at baseline.  Psychiatric:        Mood and Affect: Mood normal.        Behavior: Behavior normal.        Thought Content: Thought content normal.        Judgment: Judgment normal.     Lab Results:  CBC    Component Value Date/Time   WBC 4.5 08/05/2021 0733   RBC 5.54 08/05/2021 0733   HGB 16.2 08/05/2021 0733   HCT 47.3 08/05/2021 0733   PLT 161.0 08/05/2021 0733   MCV 85.3 08/05/2021 0733   MCH 30.0 05/11/2013 1226   MCHC 34.3 08/05/2021 0733   RDW 12.5 08/05/2021 0733   LYMPHSABS 1.9 05/11/2013 1226   MONOABS 0.5 05/11/2013 1226   EOSABS 0.1 05/11/2013 1226   BASOSABS 0.0 05/11/2013 1226    BMET    Component Value Date/Time   NA 138 08/05/2021 0733   K 4.3 08/05/2021 0733   CL 101 08/05/2021 0733   CO2 30 08/05/2021 0733   GLUCOSE 76 08/05/2021 0733   BUN 27 (H) 08/05/2021 0733   CREATININE 1.12 08/05/2021 0733   CREATININE 1.12 07/30/2020 0752   CALCIUM 9.3 08/05/2021 0733    BNP No results found for: BNP  ProBNP No results found for: PROBNP  Imaging: No results found.   Assessment & Plan:   Moderate obstructive sleep apnea - Patient had a sleep study on September 18, 2014 that showed moderate obstructive sleep apnea, AHI 16/h with SPO2 low 86%.  Patient has lost approximately 35 pounds since previous sleep study but continues to have symptoms of snoring and daytime sleepiness off of CPAP.  He is 80% compliant with CPAP greater than 4 hours.  He bought a travel CPAP to use when traveling internationally. Current pressure settings 5 to 20 cm H2O (10.6cm h20-95%) with residual AHI 1.3/hr. his current CPAP machine is greater than 63 years old and currently broken but usable.  We will place an order for patient to establish with local DME company to provide him with new CPAP machine and supplies.  Recommend loweing pressure to 5 to 15 cm  H2O.   Martyn Ehrich, NP 03/04/2022

## 2022-03-04 NOTE — Assessment & Plan Note (Signed)
-   Patient had a sleep study on September 18, 2014 that showed moderate obstructive sleep apnea, AHI 16/h with SPO2 low 86%.  Patient has lost approximately 35 pounds since previous sleep study but continues to have symptoms of snoring and daytime sleepiness off of CPAP.  He is 80% compliant with CPAP greater than 4 hours.  He bought a travel CPAP to use when traveling internationally. Current pressure settings 5 to 20 cm H2O (10.6cm h20-95%) with residual AHI 1.3/hr. his current CPAP machine is greater than 42 years old and currently broken but usable.  We will place an order for patient to establish with local DME company to provide him with new CPAP machine and supplies.  Recommend loweing pressure to 5 to 15 cm H2O.

## 2022-03-08 NOTE — Progress Notes (Signed)
Reviewed and agree with assessment/plan.   Chesley Mires, MD [facility] 03/08/2022, 1:47 PM Pager:  [phone]

## 2022-03-30 NOTE — Telephone Encounter (Signed)
PCCs, can we send the cpap order to a local DME. Patient lives in [place]. Original order was sent to [facility] in [place] originally but he wants a more local DME. Thanks!

## 2022-03-31 NOTE — Telephone Encounter (Signed)
I sent the order to [facility] and explained the situation. I will send a update once I receive a message back from [employer].

## 2022-04-09 NOTE — Telephone Encounter (Signed)
A confirmation message was received from [place] at [facility] on 6/29 at 4:09 pm.

## 2022-04-13 IMAGING — DX DG SHOULDER 2+V*R*
3 series · 3 of 3 positions shown · non-contrast
Comparison: None.

CLINICAL DATA: Right-sided shoulder pain

EXAM:
RIGHT SHOULDER - 2+ VIEW

[shoulder grashey]
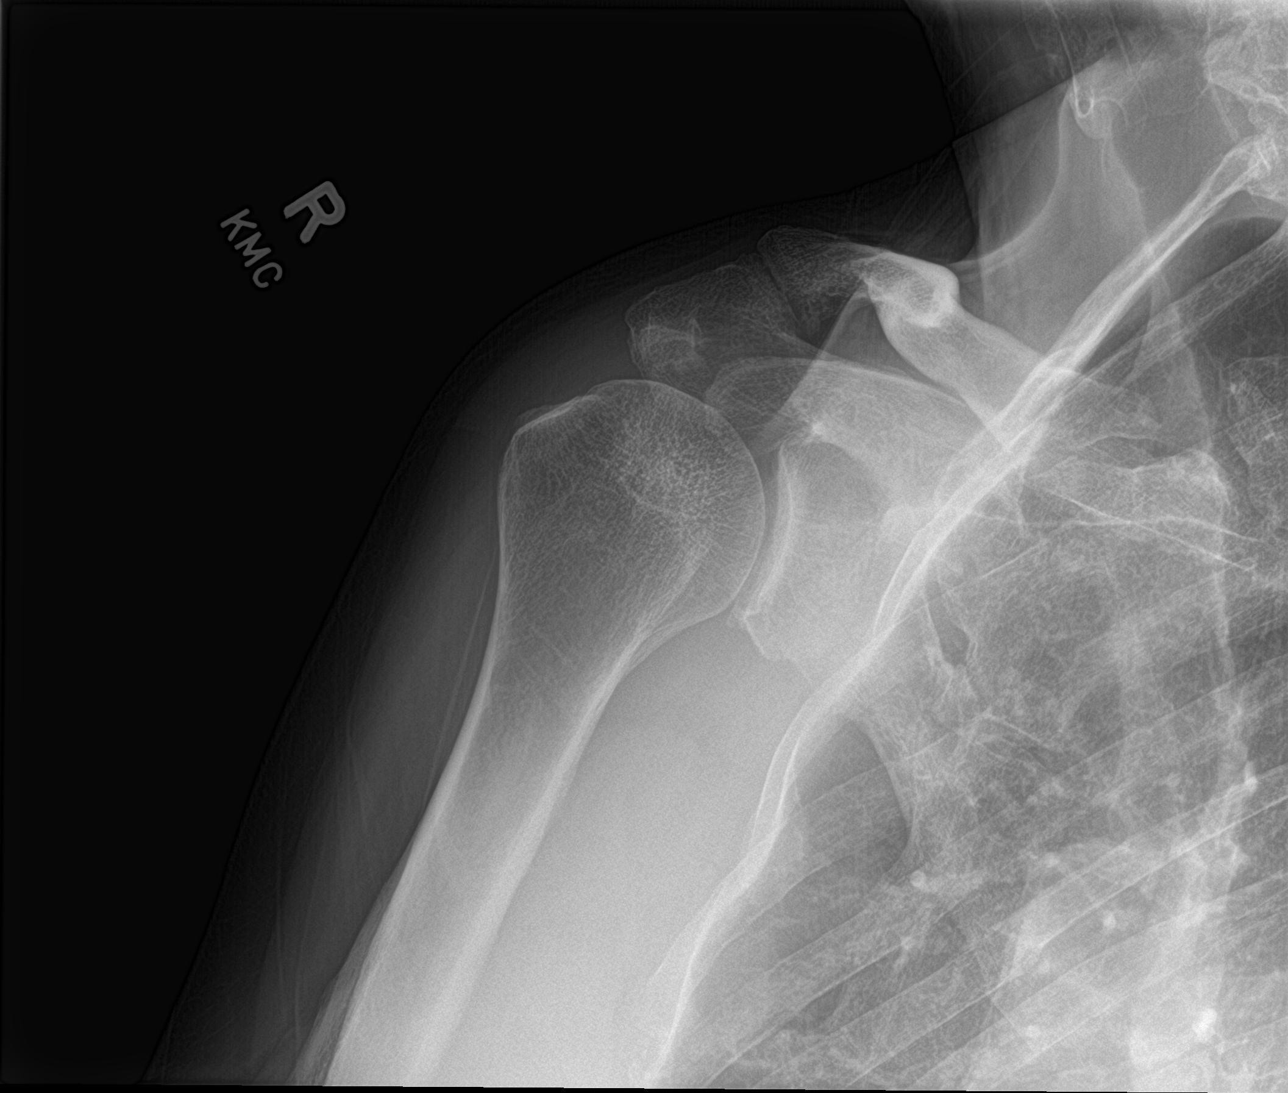

[shoulder y view]
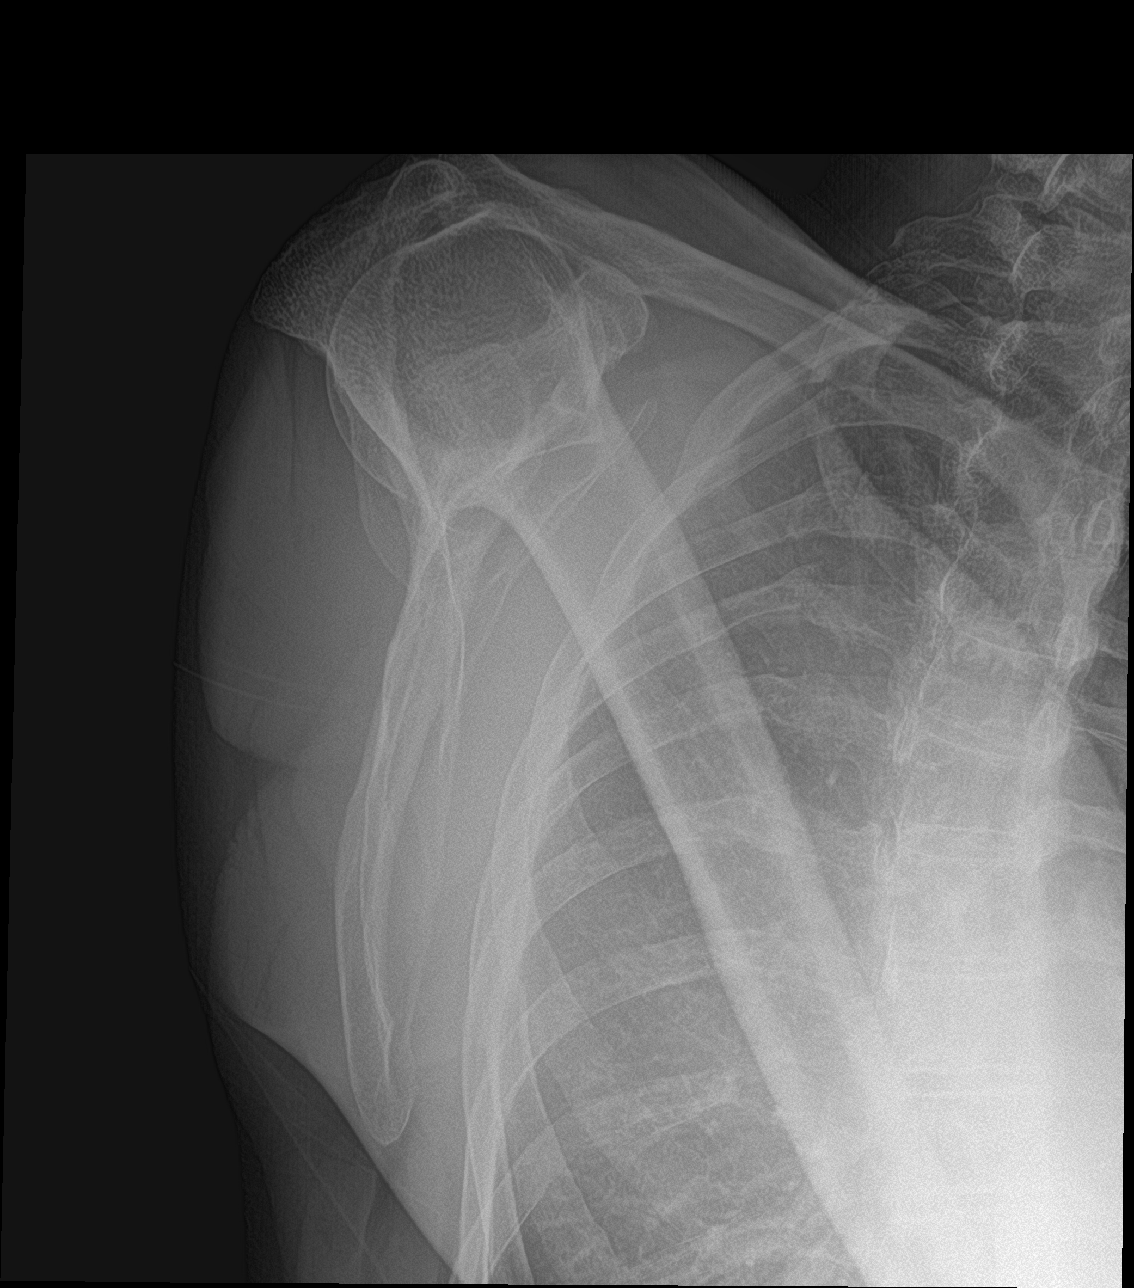

[shoulder axillary]
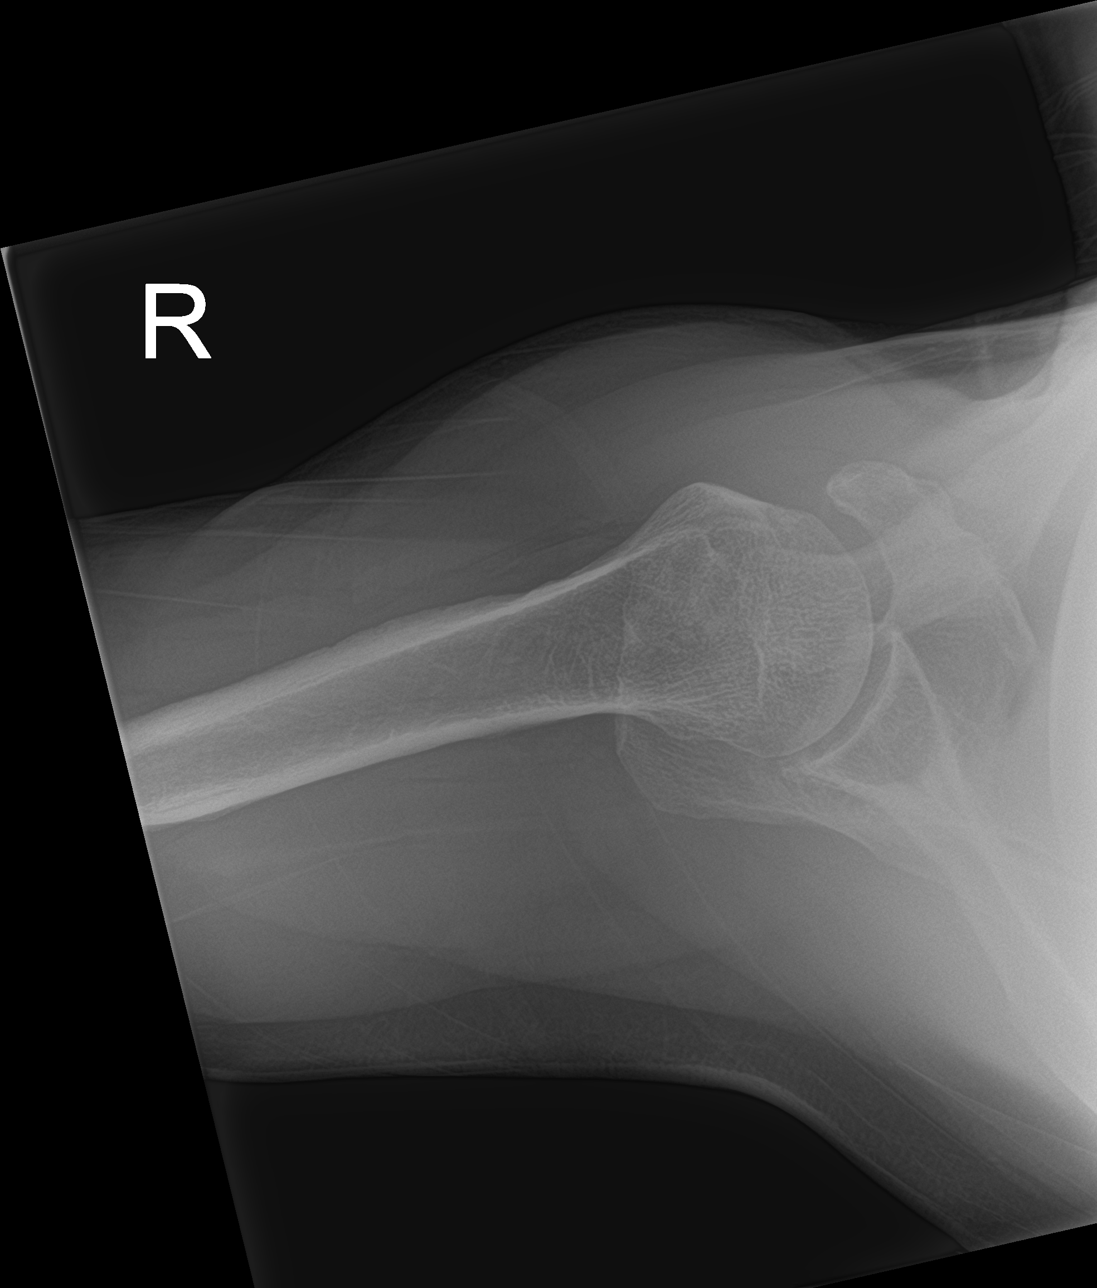

[3 of 3 positions shown; findings below may reference images not displayed]

FINDINGS: No acute fracture or dislocation is noted. Degenerative changes of
the acromioclavicular joint are seen. No soft tissue abnormality is
seen. The underlying bony thorax is within normal limits.
IMPRESSION: No acute abnormality noted.

## 2022-04-28 DIAGNOSIS — G4733 Obstructive sleep apnea (adult) (pediatric): Secondary | ICD-10-CM | POA: Diagnosis not present

## 2022-04-29 ENCOUNTER — Encounter: Payer: Self-pay | Admitting: Gastroenterology

## 2022-05-20 ENCOUNTER — Other Ambulatory Visit: Payer: Self-pay | Admitting: Family Medicine

## 2022-05-20 DIAGNOSIS — M65811 Other synovitis and tenosynovitis, right shoulder: Secondary | ICD-10-CM

## 2022-05-29 DIAGNOSIS — G4733 Obstructive sleep apnea (adult) (pediatric): Secondary | ICD-10-CM | POA: Diagnosis not present

## 2022-06-01 DIAGNOSIS — G4733 Obstructive sleep apnea (adult) (pediatric): Secondary | ICD-10-CM | POA: Diagnosis not present

## 2022-06-14 ENCOUNTER — Other Ambulatory Visit: Payer: Self-pay | Admitting: Family Medicine

## 2022-06-29 DIAGNOSIS — G4733 Obstructive sleep apnea (adult) (pediatric): Secondary | ICD-10-CM | POA: Diagnosis not present

## 2022-07-04 ENCOUNTER — Other Ambulatory Visit: Payer: Self-pay | Admitting: Family Medicine

## 2022-07-04 DIAGNOSIS — M1A9XX0 Chronic gout, unspecified, without tophus (tophi): Secondary | ICD-10-CM

## 2022-07-14 DIAGNOSIS — F4323 Adjustment disorder with mixed anxiety and depressed mood: Secondary | ICD-10-CM | POA: Diagnosis not present

## 2022-07-16 DIAGNOSIS — H2513 Age-related nuclear cataract, bilateral: Secondary | ICD-10-CM | POA: Diagnosis not present

## 2022-07-16 DIAGNOSIS — H43823 Vitreomacular adhesion, bilateral: Secondary | ICD-10-CM | POA: Diagnosis not present

## 2022-07-16 DIAGNOSIS — H35422 Microcystoid degeneration of retina, left eye: Secondary | ICD-10-CM | POA: Diagnosis not present

## 2022-07-20 DIAGNOSIS — G4733 Obstructive sleep apnea (adult) (pediatric): Secondary | ICD-10-CM | POA: Diagnosis not present

## 2022-07-29 DIAGNOSIS — G4733 Obstructive sleep apnea (adult) (pediatric): Secondary | ICD-10-CM | POA: Diagnosis not present

## 2022-08-11 ENCOUNTER — Ambulatory Visit (INDEPENDENT_AMBULATORY_CARE_PROVIDER_SITE_OTHER): Payer: BC Managed Care – PPO | Admitting: Family Medicine

## 2022-08-11 ENCOUNTER — Encounter: Payer: Self-pay | Admitting: Family Medicine

## 2022-08-11 VITALS — BP 128/82 | HR 58 | Temp 98.0°F | Ht 74.0 in | Wt 239.1 lb

## 2022-08-11 DIAGNOSIS — M1A9XX0 Chronic gout, unspecified, without tophus (tophi): Secondary | ICD-10-CM | POA: Diagnosis not present

## 2022-08-11 DIAGNOSIS — Z0000 Encounter for general adult medical examination without abnormal findings: Secondary | ICD-10-CM | POA: Diagnosis not present

## 2022-08-11 LAB — COMPREHENSIVE METABOLIC PANEL
ALT: 37 U/L (ref 0–53)
AST: 30 U/L (ref 0–37)
Albumin: 4.7 g/dL (ref 3.5–5.2)
Alkaline Phosphatase: 75 U/L (ref 39–117)
BUN: 24 mg/dL — ABNORMAL HIGH (ref 6–23)
CO2: 31 mEq/L (ref 19–32)
Calcium: 9.4 mg/dL (ref 8.4–10.5)
Chloride: 101 mEq/L (ref 96–112)
Creatinine, Ser: 1 mg/dL (ref 0.40–1.50)
GFR: 83.14 mL/min (ref >60.00)
Glucose, Bld: 82 mg/dL (ref 70–99)
Potassium: 4 mEq/L (ref 3.5–5.1)
Sodium: 138 mEq/L (ref 135–145)
Total Bilirubin: 0.9 mg/dL (ref 0.2–1.2)
Total Protein: 7.2 g/dL (ref 6.0–8.3)

## 2022-08-11 LAB — LIPID PANEL
Cholesterol: 177 mg/dL (ref 0–200)
HDL: 45.2 mg/dL (ref >39.00)
LDL Cholesterol: 100 mg/dL — ABNORMAL HIGH (ref 0–99)
NonHDL: 131.69
Total CHOL/HDL Ratio: 4
Triglycerides: 156 mg/dL — ABNORMAL HIGH (ref 0.0–149.0)
VLDL: 31.2 mg/dL (ref 0.0–40.0)

## 2022-08-11 LAB — CBC
HCT: 47.5 % (ref 39.0–52.0)
Hemoglobin: 16.3 g/dL (ref 13.0–17.0)
MCHC: 34.4 g/dL (ref 30.0–36.0)
MCV: 85.1 fl (ref 78.0–100.0)
Platelets: 159 10*3/uL (ref 150.0–400.0)
RBC: 5.58 Mil/uL (ref 4.22–5.81)
RDW: 12.7 % (ref 11.5–15.5)
WBC: 5.6 10*3/uL (ref 4.0–10.5)

## 2022-08-11 LAB — URIC ACID: Uric Acid, Serum: 5.1 mg/dL (ref 4.0–7.8)

## 2022-08-11 NOTE — Progress Notes (Signed)
Chief Complaint  Patient presents with   Annual Exam    Well Male Corey Nguyen is here for a complete physical.   His last physical was >1 year ago.  Current diet: in general, a "healthy" diet.  Current exercise: lifting weights, cardio Weight trend: up a little  Fatigue out of ordinary? No. Seat belt? Yes.   Advanced directive? Yes  Health maintenance Shingrix- No Colonoscopy- Due Tetanus- Yes HIV- Yes Hep C- Yes Lung cancer screening- Yes   Past Medical History:  Diagnosis Date   Chicken pox    childhood   Hyperlipidemia    borderline   Hypertension    Hypertriglyceridemia 07/13/2017   Idiopathic gout of knee    Measles    childhood   Mumps    childhood   Sleep apnea    CPAP      Past Surgical History:  Procedure Laterality Date   TESTICLE REMOVAL     Age 28-Undescended    Medications  Current Outpatient Medications on File Prior to Visit  Medication Sig Dispense Refill   allopurinol (ZYLOPRIM) 100 MG tablet TAKE 2 TABLETS DAILY 180 tablet 2   colchicine 0.6 MG tablet TAKE 1 TABLET BY MOUTH TWICE A DAY 180 tablet 1   Multiple Vitamin (MULTI VITAMIN MENS PO) Take 1 tablet by mouth daily.     Omega-3 Fatty Acids (FISH OIL) 1000 MG CAPS Take 1 capsule by mouth daily.     valACYclovir (VALTREX) 500 MG tablet TAKE 1 TABLET DAILY 90 tablet 2   Wheat Dextrin (BENEFIBER DRINK MIX PO) Take by mouth. Take as directed.      Allergies No Known Allergies  Family History Family History  Problem Relation Age of Onset   Hypercholesterolemia Father        Living   Hypercholesterolemia Brother    Healthy Mother        Living   Diabetes Maternal Grandfather        Insulin   Heart defect Maternal Grandmother    Alzheimer's disease Maternal Grandmother    Prostate cancer Paternal Grandfather    Healthy Paternal Grandmother        Living   Healthy Paternal Uncle    Diabetes Maternal Aunt    Healthy Maternal 23    Migraines Daughter    Healthy Other         Siblings    Review of Systems: Constitutional:  no fevers Eye:  no recent significant change in vision Ear/Nose/Mouth/Throat:  Ears:  no hearing loss Nose/Mouth/Throat:  no complaints of nasal congestion, no sore throat Cardiovascular:  no chest pain Respiratory:  no shortness of breath Gastrointestinal:  no change in bowel habits GU:  Male: negative for dysuria, frequency Musculoskeletal/Extremities:  no joint pain Integumentary (Skin/Breast):  no abnormal skin lesions reported Neurologic:  no headaches Endocrine: No unexpected weight changes Hematologic/Lymphatic:  no abnormal bleeding  Exam BP 128/82 (BP Location: Right Arm, Patient Position: Sitting, Cuff Size: Normal)   Pulse (!) 58   Temp 98 F (36.7 C) (Oral)   Ht '6\' 2"'$  (1.88 m)   Wt 239 lb 2 oz (108.5 kg)   SpO2 98%   BMI 30.70 kg/m  General:  well developed, well nourished, in no apparent distress Skin:  no significant moles, warts, or growths Head:  no masses, lesions, or tenderness Eyes:  pupils equal and round, sclera anicteric without injection Ears:  canals without lesions, TMs shiny without retraction, no obvious effusion, no erythema Nose:  nares patent, mucosa normal Throat/Pharynx:  lips and gingiva without lesion; tongue and uvula midline; non-inflamed pharynx; no exudates or postnasal drainage Neck: neck supple without adenopathy, thyromegaly, or masses Cardiac: RRR, no bruits, no LE edema Lungs:  clear to auscultation, breath sounds equal bilaterally, no respiratory distress Abdomen: BS+, soft, non-tender, non-distended, no masses or organomegaly noted Rectal: Deferred Musculoskeletal:  symmetrical muscle groups noted without atrophy or deformity Neuro:  gait normal; deep tendon reflexes normal and symmetric Psych: well oriented with normal range of affect and appropriate judgment/insight  Assessment and Plan  Well adult exam - Plan: CBC, Comprehensive metabolic panel, Lipid panel  Chronic gout  involving toe of right foot without tophus, unspecified cause - Plan: Uric acid   Well 56 y.o. male. Counseled on diet and exercise. Counseled on risks and benefits of prostate cancer screening with PSA. The patient agrees to forego testing. Advanced directive form provided today.  Shingrix rec'd.  Immunizations, labs, and further orders as above. Follow up up in 12 mo. The patient voiced understanding and agreement to the plan.  [place], DO 08/11/22 7:12 AM

## 2022-08-11 NOTE — Patient Instructions (Addendum)
Give us 2-3 business days to get the results of your labs back.   Keep the diet clean and stay active.  The Shingrix vaccine (for shingles) is a 2 shot series spaced 2-6 months apart. It can make people feel low energy, achy and almost like they have the flu for 48 hours after injection. 1/5 people can have nausea and/or vomiting. Please plan accordingly when deciding on when to get this shot. Call our office for a nurse visit appointment to get this. The second shot of the series is less severe regarding the side effects, but it still lasts 48 hours.   Please get me a copy of your advanced directive form at your convenience.   Let us know if you need anything.  

## 2022-08-13 DIAGNOSIS — F4323 Adjustment disorder with mixed anxiety and depressed mood: Secondary | ICD-10-CM | POA: Diagnosis not present

## 2022-08-29 DIAGNOSIS — G4733 Obstructive sleep apnea (adult) (pediatric): Secondary | ICD-10-CM | POA: Diagnosis not present

## 2022-09-03 ENCOUNTER — Ambulatory Visit: Payer: BC Managed Care – PPO | Admitting: Primary Care

## 2022-09-08 ENCOUNTER — Ambulatory Visit (INDEPENDENT_AMBULATORY_CARE_PROVIDER_SITE_OTHER): Payer: BC Managed Care – PPO | Admitting: Primary Care

## 2022-09-08 ENCOUNTER — Encounter: Payer: Self-pay | Admitting: Primary Care

## 2022-09-08 VITALS — BP 128/82 | HR 65 | Temp 97.6°F | Ht 74.0 in | Wt 239.4 lb

## 2022-09-08 DIAGNOSIS — G4733 Obstructive sleep apnea (adult) (pediatric): Secondary | ICD-10-CM

## 2022-09-08 NOTE — Progress Notes (Signed)
$'@Patient'u$  ID: Corey Nguyen, male    DOB: [birth year], 56 y.o.   MRN: [id number]  Chief Complaint  Patient presents with   Follow-up    Review CPAP compliance.  Sleeping good.  Better fitting mask works well.    Referring provider: Shelda Pal*  HPI:  56 year old male, never smoked.  Past medical history significant for gout, obesity and OSA on CPAP.  Previous [facility] pulmonary encounter 03/04/2022 Patient presents today for sleep consult. Patient has a history of moderate obstructive sleep apnea.  He had a sleep study on 09/18/2014 that showed moderate obstructive sleep apnea, AHI 16.8/h with SPO2 low 86%.  He is on CPAP. He has had machine for 7 years. Button on his machine is broken but still useable.  He has lost between 35 and 40 pounds since his last sleep study in 2015.  He continues to follow-uo with trainer/nutritionist.  Despite weight loss he still continues to have symptoms of snoring and daytime sleepiness.  Daytime sleepiness improved with CPAP use.  He is 80% compliant with CPAP greater than 4 hours.  Current pressure auto titrate 5 to 20 cm H2O.  Airview download 12/04/2021 - 03/03/2022 Usage 73/90 days (81%); 72 days (80%) greater than 4 hours Average usage 5 hours 29 minutes Pressure 5 to 20 cm H2O (10.6 cm H2O-95%) Air leaks 31.8 L/min (95%) AHI 1.3  Sleep questionnaire Symptoms-  Hx sleep apnea, snoring  Prior sleep study- 09/18/2014, moderate OSA with AHI 16/hr Bedtime- 10-11:30pm Time to fall asleep- 15-75mn  Nocturnal awakenings- 1-2 times Out of bed/start of day- 5:30-6:30am Weight changes- down 35-40 lbs since 2015 Do you operate heavy machinery- No Do you currently wear CPAP- Yes, auto 5-20cm h20 Do you current wear oxygen- No Epworth- 6   09/08/2022 Patient presents today for routine follow-up OSA.  Patient had sleep study on September 18, 2014 that showed moderate obstructive sleep apnea, AHI 16 an hour with SpO2 low 86%.  Patient has been  maintained on CPAP.  He is lost approximately 35 pounds but continues to have symptoms of snoring and daytime sleepiness off CPAP.  During her last office visit in June patient was set up with a local DME company for new CPAP machine and supplies.  He is doing well today. He received new cpap machine. He is having a little bit of difficulty getting the right CPAP mask with [employer] but is working on someone with this. He prefers the full face with memory foam. No issues with pressure settings, he has done well with lower max pressure setting. He needs his travel CPAP pressure settings lowered as well.   Airview download 08/08/22-09/06/22 30/30 days; 97% > 4 hours Average usage 7 hours 18 mins Pressure 5-15cm h20 (11.8cm h20-95%) Airleaks 9.6L/min (95%) AHI 1.3  No Known Allergies  Immunization History  Administered Date(s) Administered   PFIZER(Purple Top)SARS-COV-2 Vaccination 12/18/2019, 01/14/2020   Tdap 09/08/2015    Past Medical History:  Diagnosis Date   Chicken pox    childhood   Hyperlipidemia    borderline   Hypertension    Hypertriglyceridemia 07/13/2017   Idiopathic gout of knee    Measles    childhood   Mumps    childhood   Sleep apnea    CPAP    Tobacco History: Social History   Tobacco Use  Smoking Status Never  Smokeless Tobacco Never   Counseling given: Not Answered   Outpatient Medications Prior to Visit  Medication Sig Dispense Refill  allopurinol (ZYLOPRIM) 100 MG tablet TAKE 2 TABLETS DAILY 180 tablet 2   colchicine 0.6 MG tablet TAKE 1 TABLET BY MOUTH TWICE A DAY 180 tablet 1   Multiple Vitamin (MULTI VITAMIN MENS PO) Take 1 tablet by mouth daily.     Omega-3 Fatty Acids (FISH OIL) 1000 MG CAPS Take 1 capsule by mouth daily.     valACYclovir (VALTREX) 500 MG tablet TAKE 1 TABLET DAILY 90 tablet 2   Wheat Dextrin (BENEFIBER DRINK MIX PO) Take by mouth. Take as directed.     No facility-administered medications prior to visit.    Review of  Systems  Review of Systems  Constitutional: Negative.   HENT: Negative.    Respiratory: Negative.    Cardiovascular: Negative.    Physical Exam  BP 128/82 (BP Location: Left Arm, Patient Position: Sitting, Cuff Size: Large)   Pulse 65   Temp 97.6 F (36.4 C) (Oral)   Ht '6\' 2"'$  (1.88 m)   Wt 239 lb 6.4 oz (108.6 kg)   SpO2 98%   BMI 30.74 kg/m  Physical Exam Constitutional:      Appearance: Normal appearance.  HENT:     Head: Normocephalic and atraumatic.     Mouth/Throat:     Mouth: Mucous membranes are moist.     Pharynx: Oropharynx is clear.  Cardiovascular:     Rate and Rhythm: Normal rate and regular rhythm.  Pulmonary:     Effort: Pulmonary effort is normal.     Breath sounds: Normal breath sounds.  Skin:    General: Skin is warm and dry.  Neurological:     General: No focal deficit present.     Mental Status: He is alert and oriented to person, place, and time. Mental status is at baseline.  Psychiatric:        Mood and Affect: Mood normal.        Behavior: Behavior normal.        Thought Content: Thought content normal.        Judgment: Judgment normal.      Lab Results:  CBC    Component Value Date/Time   WBC 5.6 08/11/2022 0726   RBC 5.58 08/11/2022 0726   HGB 16.3 08/11/2022 0726   HCT 47.5 08/11/2022 0726   PLT 159.0 08/11/2022 0726   MCV 85.1 08/11/2022 0726   MCH 30.0 05/11/2013 1226   MCHC 34.4 08/11/2022 0726   RDW 12.7 08/11/2022 0726   LYMPHSABS 1.9 05/11/2013 1226   MONOABS 0.5 05/11/2013 1226   EOSABS 0.1 05/11/2013 1226   BASOSABS 0.0 05/11/2013 1226    BMET    Component Value Date/Time   NA 138 08/11/2022 0726   K 4.0 08/11/2022 0726   CL 101 08/11/2022 0726   CO2 31 08/11/2022 0726   GLUCOSE 82 08/11/2022 0726   BUN 24 (H) 08/11/2022 0726   CREATININE 1.00 08/11/2022 0726   CREATININE 1.12 07/30/2020 0752   CALCIUM 9.4 08/11/2022 0726    BNP No results found for: "BNP"  ProBNP No results found for:  "PROBNP"  Imaging: No results found.   Assessment & Plan:   Moderate obstructive sleep apnea - Sleep study in December 2015 >> average AHI 16/hour with SpO2 low 86%.  He is maintained on CPAP, auto settings lowered during last visit. Current pressure 5-15cm h20 (11.8cm h20-95%); Residual AHI 1.3/hr. No changes today. Needs order to have travel machine settings adjusted. DME company is [employer]. FU in 1 year or sooner.  Martyn Ehrich, NP 09/08/2022

## 2022-09-08 NOTE — Patient Instructions (Addendum)
CPAP compliance is excellent, apneas are well-controlled on current settings 5-15cm h20  Please bring travel CPAP by [employer] and have them lower CPAP pressure settings   Orders Please adjust travel CPAP pressure to auto setting 5 to 15 cm H2O DME [employer]   Follow-up 1 year with [facility] NP or sooner if needed

## 2022-09-08 NOTE — Assessment & Plan Note (Addendum)
-   Sleep study in December 2015 >> average AHI 16/hour with SpO2 low 86%.  He is maintained on CPAP, auto settings lowered during last visit. Current pressure 5-15cm h20 (11.8cm h20-95%); Residual AHI 1.3/hr. No changes today. Needs order to have travel machine settings adjusted. DME company is [employer]. FU in 1 year or sooner.

## 2022-09-13 NOTE — Progress Notes (Signed)
Reviewed and agree with assessment/plan.   Chesley Mires, MD [facility] Pulmonary/Critical Care 09/13/2022, 7:04 AM Pager:  [phone]

## 2022-09-24 DIAGNOSIS — G4733 Obstructive sleep apnea (adult) (pediatric): Secondary | ICD-10-CM | POA: Diagnosis not present

## 2022-09-28 DIAGNOSIS — G4733 Obstructive sleep apnea (adult) (pediatric): Secondary | ICD-10-CM | POA: Diagnosis not present

## 2022-09-30 DIAGNOSIS — F4323 Adjustment disorder with mixed anxiety and depressed mood: Secondary | ICD-10-CM | POA: Diagnosis not present

## 2022-10-08 ENCOUNTER — Ambulatory Visit: Payer: Self-pay

## 2022-10-08 ENCOUNTER — Ambulatory Visit (INDEPENDENT_AMBULATORY_CARE_PROVIDER_SITE_OTHER): Payer: BC Managed Care – PPO | Admitting: Family Medicine

## 2022-10-08 ENCOUNTER — Encounter: Payer: Self-pay | Admitting: Family Medicine

## 2022-10-08 VITALS — BP 126/82 | Ht 74.0 in | Wt 235.0 lb

## 2022-10-08 DIAGNOSIS — M778 Other enthesopathies, not elsewhere classified: Secondary | ICD-10-CM | POA: Diagnosis not present

## 2022-10-08 MED ORDER — TRIAMCINOLONE ACETONIDE 40 MG/ML IJ SUSP
40.0000 mg | Freq: Once | INTRAMUSCULAR | Status: AC
  Administered 2022-10-08: 40 mg via INTRA_ARTICULAR

## 2022-10-08 NOTE — Patient Instructions (Signed)
Good to see you Please alternate heat and ice  Please continue the exercises  Please let me know if the pain returns   Please send me a message in MyChart with any questions or updates.  Please see me back as needed.   --Dr. Raeford Razor

## 2022-10-08 NOTE — Assessment & Plan Note (Signed)
Acute on chronic in nature.  Has been dealing with the pain for years and may be more of a capsulitis picture. -Counseled on home exercise therapy and supportive care. -Injection today. -Could consider further imaging

## 2022-10-08 NOTE — Progress Notes (Signed)
  Corey Nguyen - 57 y.o. male MRN [id number]  Date of birth: 02-Sep-1966  SUBJECTIVE:  Including CC & ROS.  No chief complaint on file.   Corey Nguyen is a 56 y.o. male that is presenting with acute on chronic right shoulder pain.  The pain has been present for about 2 years.  He has tried different injections and physical therapy.  He still has pain when he initiates a workout.   Review of Systems See HPI   HISTORY: Past Medical, Surgical, Social, and Family History Reviewed & Updated per EMR.   Pertinent Historical Findings include:  Past Medical History:  Diagnosis Date   Chicken pox    childhood   Hyperlipidemia    borderline   Hypertension    Hypertriglyceridemia 07/13/2017   Idiopathic gout of knee    Measles    childhood   Mumps    childhood   Sleep apnea    CPAP    Past Surgical History:  Procedure Laterality Date   TESTICLE REMOVAL     Age 65-Undescended     PHYSICAL EXAM:  VS: BP 126/82   Ht '6\' 2"'$  (1.88 m)   Wt 235 lb (106.6 kg)   BMI 30.17 kg/m  Physical Exam Gen: NAD, alert, cooperative with exam, well-appearing MSK:  Neurovascularly intact     Aspiration/Injection Procedure Note Corey Nguyen Nov 28, 1965  Procedure: Injection Indications: Right shoulder pain  Procedure Details Consent: Risks of procedure as well as the alternatives and risks of each were explained to the (patient/caregiver).  Consent for procedure obtained. Time Out: Verified patient identification, verified procedure, site/side was marked, verified correct patient position, special equipment/implants available, medications/allergies/relevent history reviewed, required imaging and test results available.  Performed.  The area was cleaned with iodine and alcohol swabs.    The right glenohumeral joint was injected with 3 cc of 1% lidocaine on a 22-gauge 3-1/2 inch needle.  The syringe was switched and a mixture containing 3 cc 1% lidocaine, 3 cc of 0.25% bupivacaine and 1 cc  of 40 mg Kenalog was injected ultrasound was used. Images were obtained in short views showing the injection.     A sterile dressing was applied.  Patient did tolerate procedure well.     ASSESSMENT & PLAN:   Capsulitis of right shoulder Acute on chronic in nature.  Has been dealing with the pain for years and may be more of a capsulitis picture. -Counseled on home exercise therapy and supportive care. -Injection today. -Could consider further imaging

## 2022-10-29 DIAGNOSIS — G4733 Obstructive sleep apnea (adult) (pediatric): Secondary | ICD-10-CM | POA: Diagnosis not present

## 2022-11-25 DIAGNOSIS — G4733 Obstructive sleep apnea (adult) (pediatric): Secondary | ICD-10-CM | POA: Diagnosis not present

## 2022-11-29 DIAGNOSIS — G4733 Obstructive sleep apnea (adult) (pediatric): Secondary | ICD-10-CM | POA: Diagnosis not present

## 2022-12-24 DIAGNOSIS — G4733 Obstructive sleep apnea (adult) (pediatric): Secondary | ICD-10-CM | POA: Diagnosis not present

## 2022-12-28 DIAGNOSIS — F4323 Adjustment disorder with mixed anxiety and depressed mood: Secondary | ICD-10-CM | POA: Diagnosis not present

## 2022-12-28 DIAGNOSIS — G4733 Obstructive sleep apnea (adult) (pediatric): Secondary | ICD-10-CM | POA: Diagnosis not present

## 2023-01-17 ENCOUNTER — Encounter: Payer: Self-pay | Admitting: *Deleted

## 2023-01-27 ENCOUNTER — Encounter: Payer: Self-pay | Admitting: Family Medicine

## 2023-01-27 DIAGNOSIS — G4733 Obstructive sleep apnea (adult) (pediatric): Secondary | ICD-10-CM | POA: Diagnosis not present

## 2023-01-28 ENCOUNTER — Other Ambulatory Visit: Payer: Self-pay | Admitting: Family Medicine

## 2023-01-28 DIAGNOSIS — M659 Synovitis and tenosynovitis, unspecified: Secondary | ICD-10-CM

## 2023-01-28 MED ORDER — COLCHICINE 0.6 MG PO TABS: 0.6000 mg | ORAL_TABLET | Freq: Two times a day (BID) | ORAL | 1 refills | Status: DC

## 2023-02-26 DIAGNOSIS — G4733 Obstructive sleep apnea (adult) (pediatric): Secondary | ICD-10-CM | POA: Diagnosis not present

## 2023-03-04 ENCOUNTER — Other Ambulatory Visit: Payer: Self-pay | Admitting: Family Medicine

## 2023-03-27 ENCOUNTER — Other Ambulatory Visit: Payer: Self-pay | Admitting: Family Medicine

## 2023-03-27 DIAGNOSIS — M1A9XX0 Chronic gout, unspecified, without tophus (tophi): Secondary | ICD-10-CM

## 2023-03-29 DIAGNOSIS — G4733 Obstructive sleep apnea (adult) (pediatric): Secondary | ICD-10-CM | POA: Diagnosis not present

## 2023-06-16 DIAGNOSIS — G4733 Obstructive sleep apnea (adult) (pediatric): Secondary | ICD-10-CM | POA: Diagnosis not present

## 2023-07-16 DIAGNOSIS — G4733 Obstructive sleep apnea (adult) (pediatric): Secondary | ICD-10-CM | POA: Diagnosis not present

## 2023-07-25 ENCOUNTER — Encounter: Payer: Self-pay | Admitting: Family Medicine

## 2023-07-25 ENCOUNTER — Ambulatory Visit: Payer: BC Managed Care – PPO | Admitting: Family Medicine

## 2023-07-25 VITALS — BP 120/78 | HR 63 | Temp 97.8°F | Ht 74.0 in | Wt 244.1 lb

## 2023-07-25 DIAGNOSIS — M1A9XX0 Chronic gout, unspecified, without tophus (tophi): Secondary | ICD-10-CM | POA: Diagnosis not present

## 2023-07-25 DIAGNOSIS — M79674 Pain in right toe(s): Secondary | ICD-10-CM | POA: Diagnosis not present

## 2023-07-25 MED ORDER — CELECOXIB 100 MG PO CAPS
ORAL_CAPSULE | ORAL | 2 refills | Status: DC
Start: 2023-07-25 — End: 2023-10-24

## 2023-07-25 NOTE — Progress Notes (Signed)
Musculoskeletal Exam  Patient: Corey Nguyen DOB: 07-Aug-1966  DOS: 07/25/2023  SUBJECTIVE:  Chief Complaint:   Chief Complaint  Patient presents with   Toe Pain    Corey Nguyen is a 57 y.o.  male for evaluation and treatment of toe pain.   Onset:  3 weeks ago. Was walking more, but no obvious injury.  Location: R great toe Character:  aching  Progression of issue:  has improved Associated symptoms: had bruised and blistered, some swelling No redness, fevers or issues walking.  Treatment: to date has been prescription NSAIDS and acetaminophen.   Neurovascular symptoms: no  Past Medical History:  Diagnosis Date   Chicken pox    childhood   Hyperlipidemia    borderline   Hypertension    Hypertriglyceridemia 07/13/2017   Idiopathic gout of knee    Measles    childhood   Mumps    childhood   Sleep apnea    CPAP    Objective: VITAL SIGNS: BP 120/78 (BP Location: Left Arm, Patient Position: Sitting, Cuff Size: Large)   Pulse 63   Temp 97.8 F (36.6 C) (Oral)   Ht 6\' 2"  (1.88 m)   Wt 244 lb 2 oz (110.7 kg)   SpO2 99%   BMI 31.34 kg/m  Constitutional: Well formed, well developed. No acute distress. Thorax & Lungs: No accessory muscle use Musculoskeletal: R toe.   Normal active range of motion: yes.   Normal passive range of motion: yes Has baseline decreased IP flexion Tenderness to palpation: no Deformity: no Ecchymosis: no Brisk cap refill.  Excoriated area on dorsum of distal phalanx without erythema, fluctuance, drainage Neurologic: Normal sensory function. Gait nml.  Psychiatric: Normal mood. Age appropriate judgment and insight. Alert & oriented x 3.    Assessment:  Toe pain, right  Chronic gout involving toe of right foot without tophus, unspecified cause - Plan: celecoxib (CELEBREX) 100 MG capsule  Plan: Watchful waiting, ice, Tylenol. Considered XR. He will let me know if he wishes to pursue this but he is neurologically intact and  currently doing well.  F/u prn. The patient voiced understanding and agreement to the plan.   Jilda Roche [place], DO 07/25/23  8:13 AM

## 2023-07-25 NOTE — Patient Instructions (Addendum)
Activity as tolerated.  OK to take Tylenol 1000 mg (2 extra strength tabs) or 975 mg (3 regular strength tabs) every 6 hours as needed.  Let me know if anything changes.   Let us know if you need anything.

## 2023-08-10 DIAGNOSIS — F4323 Adjustment disorder with mixed anxiety and depressed mood: Secondary | ICD-10-CM | POA: Diagnosis not present

## 2023-08-16 DIAGNOSIS — G4733 Obstructive sleep apnea (adult) (pediatric): Secondary | ICD-10-CM | POA: Diagnosis not present

## 2023-08-17 ENCOUNTER — Encounter: Payer: BC Managed Care – PPO | Admitting: Family Medicine

## 2023-08-24 ENCOUNTER — Encounter: Payer: Self-pay | Admitting: Family Medicine

## 2023-08-24 ENCOUNTER — Other Ambulatory Visit: Payer: Self-pay

## 2023-08-24 ENCOUNTER — Ambulatory Visit (INDEPENDENT_AMBULATORY_CARE_PROVIDER_SITE_OTHER): Payer: BC Managed Care – PPO | Admitting: Family Medicine

## 2023-08-24 VITALS — BP 128/80 | HR 67 | Temp 98.0°F | Resp 16 | Ht 74.0 in | Wt 246.2 lb

## 2023-08-24 DIAGNOSIS — Z0000 Encounter for general adult medical examination without abnormal findings: Secondary | ICD-10-CM

## 2023-08-24 DIAGNOSIS — L819 Disorder of pigmentation, unspecified: Secondary | ICD-10-CM | POA: Diagnosis not present

## 2023-08-24 DIAGNOSIS — E781 Pure hyperglyceridemia: Secondary | ICD-10-CM

## 2023-08-24 DIAGNOSIS — M1A9XX0 Chronic gout, unspecified, without tophus (tophi): Secondary | ICD-10-CM

## 2023-08-24 LAB — CBC
HCT: 49.4 % (ref 39.0–52.0)
Hemoglobin: 17.1 g/dL — ABNORMAL HIGH (ref 13.0–17.0)
MCHC: 34.7 g/dL (ref 30.0–36.0)
MCV: 85.2 fL (ref 78.0–100.0)
Platelets: 165 10*3/uL (ref 150.0–400.0)
RBC: 5.8 Mil/uL (ref 4.22–5.81)
RDW: 13 % (ref 11.5–15.5)
WBC: 5.5 10*3/uL (ref 4.0–10.5)

## 2023-08-24 LAB — COMPREHENSIVE METABOLIC PANEL
ALT: 26 U/L (ref 0–53)
AST: 28 U/L (ref 0–37)
Albumin: 4.9 g/dL (ref 3.5–5.2)
Alkaline Phosphatase: 77 U/L (ref 39–117)
BUN: 25 mg/dL — ABNORMAL HIGH (ref 6–23)
CO2: 30 meq/L (ref 19–32)
Calcium: 9.5 mg/dL (ref 8.4–10.5)
Chloride: 101 meq/L (ref 96–112)
Creatinine, Ser: 1.12 mg/dL (ref 0.40–1.50)
GFR: 72.04 mL/min (ref >60.00)
Glucose, Bld: 83 mg/dL (ref 70–99)
Potassium: 4.2 meq/L (ref 3.5–5.1)
Sodium: 138 meq/L (ref 135–145)
Total Bilirubin: 0.9 mg/dL (ref 0.2–1.2)
Total Protein: 7.4 g/dL (ref 6.0–8.3)

## 2023-08-24 LAB — LIPID PANEL
Cholesterol: 184 mg/dL (ref 0–200)
HDL: 40.6 mg/dL (ref >39.00)
LDL Cholesterol: 112 mg/dL — ABNORMAL HIGH (ref 0–99)
NonHDL: 143.16
Total CHOL/HDL Ratio: 5
Triglycerides: 155 mg/dL — ABNORMAL HIGH (ref 0.0–149.0)
VLDL: 31 mg/dL (ref 0.0–40.0)

## 2023-08-24 LAB — URIC ACID: Uric Acid, Serum: 5.3 mg/dL (ref 4.0–7.8)

## 2023-08-24 MED ORDER — VALACYCLOVIR HCL 500 MG PO TABS: 500.0000 mg | ORAL_TABLET | Freq: Every day | ORAL | 2 refills | Status: DC

## 2023-08-24 MED ORDER — ALLOPURINOL 100 MG PO TABS: 200.0000 mg | ORAL_TABLET | Freq: Every day | ORAL | 2 refills | Status: DC

## 2023-08-24 NOTE — Progress Notes (Signed)
Chief Complaint  Patient presents with   Annual Exam    Annual Exam    Well Male Corey Nguyen is here for a complete physical.   His last physical was >1 year ago.  Current diet: in general, a "healthy" diet.  Current exercise: lifting wts, walking Weight trend: stable Fatigue out of ordinary? No. Seat belt? Yes.   Advanced directive? No  Health maintenance Shingrix- No Colonoscopy- Yes Tetanus- Yes HIV- Yes Hep C- Yes  Gout Patient is currently taking colchicine 0.6 mg daily and allopurinol 200 mg daily.  Sports medicine put him on the former medication.  He has intermittent flares.  It affects his right great toe.  He is careful with his diet overall.  No current flares.  He is having some discoloration over the past month in his right great toe and is wondering what it could be.  No lingering pain.   Past Medical History:  Diagnosis Date   Chicken pox    childhood   Hyperlipidemia    borderline   Hypertension    Hypertriglyceridemia 07/13/2017   Idiopathic gout of knee    Measles    childhood   Mumps    childhood   Sleep apnea    CPAP      Past Surgical History:  Procedure Laterality Date   TESTICLE REMOVAL     Age 64-Undescended    Medications  Current Outpatient Medications on File Prior to Visit  Medication Sig Dispense Refill   allopurinol (ZYLOPRIM) 100 MG tablet TAKE 2 TABLETS DAILY 180 tablet 2   celecoxib (CELEBREX) 100 MG capsule Take 1 capsules twice daily as needed for pain. 60 capsule 2   colchicine 0.6 MG tablet Take 1 tablet (0.6 mg total) by mouth 2 (two) times daily. 180 tablet 1   Multiple Vitamin (MULTI VITAMIN MENS PO) Take 1 tablet by mouth daily.     Omega-3 Fatty Acids (FISH OIL) 1000 MG CAPS Take 1 capsule by mouth daily.     valACYclovir (VALTREX) 500 MG tablet TAKE 1 TABLET DAILY 90 tablet 2   Wheat Dextrin (BENEFIBER DRINK MIX PO) Take by mouth. Take as directed.      Allergies No Known Allergies  Family History Family  History  Problem Relation Age of Onset   Hypercholesterolemia Father        Living   Hypercholesterolemia Brother    Healthy Mother        Living   Diabetes Maternal Grandfather        Insulin   Heart defect Maternal Grandmother    Alzheimer's disease Maternal Grandmother    Prostate cancer Paternal Grandfather    Healthy Paternal Grandmother        Living   Healthy Paternal Uncle    Diabetes Maternal Aunt    Healthy Maternal Aunt    Migraines Daughter    Healthy Other        Siblings    Review of Systems: Constitutional:  no fevers Eye:  no recent significant change in vision Ear/Nose/Mouth/Throat:  Ears:  no hearing loss Nose/Mouth/Throat:  no complaints of nasal congestion, no sore throat Cardiovascular:  no chest pain Respiratory:  no shortness of breath Gastrointestinal:  no change in bowel habits GU:  Male: negative for dysuria, frequency Musculoskeletal/Extremities:  no new  joint pain Integumentary (Skin/Breast):  +discoloration of R great toe Neurologic:  no headaches Endocrine: No unexpected weight changes Hematologic/Lymphatic:  no abnormal bleeding  Exam BP 128/80 (BP Location: Left  Arm, Patient Position: Sitting, Cuff Size: Normal)   Pulse 67   Temp 98 F (36.7 C) (Oral)   Resp 16   Ht 6\' 2"  (1.88 m)   Wt 246 lb 3.2 oz (111.7 kg)   SpO2 98%   BMI 31.61 kg/m  General:  well developed, well nourished, in no apparent distress Skin: Slight hyperemia over the right great toe compared to the left, there is no excessive warmth or lack thereof compared to the left; no TTP, otherwise no significant moles, warts, or growths Head:  no masses, lesions, or tenderness Eyes:  pupils equal and round, sclera anicteric without injection Ears:  canals without lesions, TMs shiny without retraction, no obvious effusion, no erythema Nose:  nares patent, mucosa normal Throat/Pharynx:  lips and gingiva without lesion; tongue and uvula midline; non-inflamed pharynx; no  exudates or postnasal drainage Neck: neck supple without adenopathy, thyromegaly, or masses Cardiac: RRR, no bruits, no LE edema Lungs:  clear to auscultation, breath sounds equal bilaterally, no respiratory distress Abdomen: BS+, soft, non-tender, non-distended, no masses or organomegaly noted Rectal: Deferred Musculoskeletal:  symmetrical muscle groups noted without atrophy or deformity Neuro:  gait normal; deep tendon reflexes normal and symmetric Psych: well oriented with normal range of affect and appropriate judgment/insight  Assessment and Plan  Well adult exam - Plan: CBC, Comprehensive metabolic panel, Lipid panel  Chronic gout involving toe of right foot without tophus, unspecified cause - Plan: Uric acid  Discoloration of skin of toe - Plan: Ambulatory referral to Podiatry   Well 57 y.o. male. Counseled on diet and exercise. Counseled on risks and benefits of prostate cancer screening with PSA. The patient agrees to forego testing. Advanced directive form provided today.  Shingrix recommended. Gout: Chronic, probably controlled.  Check uric acid.  Continue allopurinol 200 mg daily.  Stop colchicine.  Celebrex for breakthrough/flares.  We may increase allopurinol depending on what the results are. Immunizations, labs, and further orders as above. Follow up 1 yr. The patient voiced understanding and agreement to the plan.  Jilda Roche [place], DO 08/24/23 7:45 AM

## 2023-08-24 NOTE — Patient Instructions (Signed)
Give us 2-3 business days to get the results of your labs back.   Keep the diet clean and stay active.  If you do not hear anything about your referral in the next 1-2 weeks, call our office and ask for an update.  Let us know if you need anything. 

## 2023-09-09 ENCOUNTER — Telehealth: Payer: BC Managed Care – PPO | Admitting: Primary Care

## 2023-09-09 DIAGNOSIS — G4733 Obstructive sleep apnea (adult) (pediatric): Secondary | ICD-10-CM

## 2023-09-09 NOTE — Patient Instructions (Signed)
Excellent compliance with CPAP, sleep apnea is well controlled on current settings  Continue to wear nightly No changes to pressure settings  Renew CPAP supplies for 1 year with [employer] (you should be able to purchase tubing for travel CPAP without prescription but I did enter on order to [employer]. Let me now if you have any issues)  Follow-up 1 year with [facility] NP or sooner if needed

## 2023-09-09 NOTE — Progress Notes (Signed)
Virtual Visit via Video Note  I connected with Corey Nguyen on 09/09/23 at  3:00 PM EST by a video enabled telemedicine application and verified that I am speaking with the correct person using two identifiers.  Location: Patient: Home Provider: Office    I discussed the limitations of evaluation and management by telemedicine and the availability of in person appointments. The patient expressed understanding and agreed to proceed.  History of Present Illness:   57 year old male, never smoked.  Past medical history significant for gout, obesity and OSA on CPAP.  Previous [facility] pulmonary encounter 03/04/2022 Patient presents today for sleep consult. Patient has a history of moderate obstructive sleep apnea.  He had a sleep study on 09/18/2014 that showed moderate obstructive sleep apnea, AHI 16.8/h with SPO2 low 86%.  He is on CPAP. He has had machine for 7 years. Button on his machine is broken but still useable.  He has lost between 35 and 40 pounds since his last sleep study in 2015.  He continues to follow-uo with trainer/nutritionist.  Despite weight loss he still continues to have symptoms of snoring and daytime sleepiness.  Daytime sleepiness improved with CPAP use.  He is 80% compliant with CPAP greater than 4 hours.  Current pressure auto titrate 5 to 20 cm H2O.  Airview download 12/04/2021 - 03/03/2022 Usage 73/90 days (81%); 72 days (80%) greater than 4 hours Average usage 5 hours 29 minutes Pressure 5 to 20 cm H2O (10.6 cm H2O-95%) Air leaks 31.8 L/min (95%) AHI 1.3  Sleep questionnaire Symptoms-  Hx sleep apnea, snoring  Prior sleep study- 09/18/2014, moderate OSA with AHI 16/hr Bedtime- 10-11:30pm Time to fall asleep- 15-68min  Nocturnal awakenings- 1-2 times Out of bed/start of day- 5:30-6:30am Weight changes- down 35-40 lbs since 2015 Do you operate heavy machinery- No Do you currently wear CPAP- Yes, auto 5-20cm h20 Do you current wear oxygen- No Epworth-  6   09/08/2022 Patient presents today for routine follow-up OSA.  Patient had sleep study on September 18, 2014 that showed moderate obstructive sleep apnea, AHI 16 an hour with SpO2 low 86%.  Patient has been maintained on CPAP.  He is lost approximately 35 pounds but continues to have symptoms of snoring and daytime sleepiness off CPAP.  During his last office visit in June patient was set up with a local DME company for new CPAP machine and supplies.  He is doing well today. He received new cpap machine. He is having a little bit of difficulty getting the right CPAP mask with [employer] but is working on someone with this. He prefers the full face with memory foam. No issues with pressure settings, he has done well with lower max pressure setting. He needs his travel CPAP pressure settings lowered as well.   Airview download 08/08/22-09/06/22 30/30 days; 97% > 4 hours Average usage 7 hours 18 mins Pressure 5-15cm h20 (11.8cm h20-95%) Airleaks 9.6L/min (95%) AHI 1.3  09/09/2023- Interim hx  Patient contacted today for routine follow-up OSA.  Patient has sleep study in 2015 that showed moderate obstructive sleep apnea, AHI 16/h.  Patient is maintained on CPAP.  Weight loss patient continues to have symptoms of snoring and daytime sleepiness and off CPAP.  Patient received new CPAP machine in June 2023.  Pressure settings on his travel CPAP machine were lowered. DME company is [employer].  Doing well. He remains compliant with CPAP use nightly. No issues with daytime sleepiness. He is not waking up gasping/choking. Occasionally sleeps with  his mouth open if congested. Needs order to renew CPAP supplies. Uses travel machine occasionally, need new CPAP tubing for that as well.   Airview download 08/09/2023 - 09/07/2023 Usage days 27/30 days (90%) > 4 hours Average usage 6 hours 55 minutes 5-15 setting 5 to 15 cm H2O (10.3 cm H2O-95%) Air leaks 8.2 L/min (95%) AHI 1.0  Observations/Objective:  Appears well  without overt respiratory symptoms  Assessment and Plan:  Obstructive sleep apnea - Patient is 90% compliant with CPAP use greater than 4 hours over the last 30 days (patient does use travel CPAP machine which accounts for occasional gap on  [employer]). He received new CPAP unit in June 2023. He reports benefit from CPAP use.  No longer experiencing excessive daytime sleepiness. Current pressure 5-15cm h20; Residual AHI 1/hour indicating good control. Renew CPAP supplies with [employer]. No changes. Advised patient continue to wear CPAP nightly.   Follow Up Instructions:  1 year with Beth NP or sooner if needed    I discussed the assessment and treatment plan with the patient. The patient was provided an opportunity to ask questions and all were answered. The patient agreed with the plan and demonstrated an understanding of the instructions.   The patient was advised to call back or seek an in-person evaluation if the symptoms worsen or if the condition fails to improve as anticipated.  I provided 22 minutes of non-face-to-face time during this encounter.   Glenford Bayley, NP

## 2023-09-19 ENCOUNTER — Ambulatory Visit (INDEPENDENT_AMBULATORY_CARE_PROVIDER_SITE_OTHER): Payer: BC Managed Care – PPO | Admitting: Podiatry

## 2023-09-19 ENCOUNTER — Ambulatory Visit (INDEPENDENT_AMBULATORY_CARE_PROVIDER_SITE_OTHER): Payer: BC Managed Care – PPO

## 2023-09-19 ENCOUNTER — Encounter: Payer: Self-pay | Admitting: Podiatry

## 2023-09-19 DIAGNOSIS — M19071 Primary osteoarthritis, right ankle and foot: Secondary | ICD-10-CM | POA: Diagnosis not present

## 2023-09-19 DIAGNOSIS — M778 Other enthesopathies, not elsewhere classified: Secondary | ICD-10-CM

## 2023-09-19 NOTE — Progress Notes (Signed)
   Chief Complaint  Patient presents with   Nail Problem    Patient states he has gout in that tor( right foot hallux ) patient states a couple months ago he started having pain , and he pulled his sock off and it looked awful . His right hallux was different colors but beer is one of the reason that can cause his gout to act out.  Patient will like to know why his right hallux is bigger then his other toe.     HPI: 57 y.o. male presenting today for evaluation of history of chronic intermittent gout to the right foot.  Patient states that over the years he has noticed the joints of his right foot becoming larger.  He does have a chronic history of intermittent gout controlled mostly by diet.  Currently he has no pain or tenderness or symptoms of acute gout associated to the right foot.  Past Medical History:  Diagnosis Date   Chicken pox    childhood   Hyperlipidemia    borderline   Hypertension    Hypertriglyceridemia 07/13/2017   Idiopathic gout of knee    Measles    childhood   Mumps    childhood   Sleep apnea    CPAP    Past Surgical History:  Procedure Laterality Date   TESTICLE REMOVAL     Age 53-Undescended    No Known Allergies   Physical Exam: General: The patient is alert and oriented x3 in no acute distress.  Dermatology: Skin is warm, dry and supple bilateral lower extremities.   Vascular: Palpable pedal pulses bilaterally. Capillary refill within normal limits.  No appreciable edema.  No erythema.  Neurological: Grossly intact via light touch  Musculoskeletal Exam: Foot is in gross alignment.  Limited range of motion with arthritic conditions noted specifically to the IPJ and MTP of the first ray.  No pain with palpation or range of motion.  Radiographic Exam RT foot 09/19/2023:  Normal osseous mineralization.  Next arthritic conditions with severe degenerative changes noted to the IPJ and MTP of the right great toe.  Degenerative changes also noted at the  talonavicular joint.  No acute fractures identified.  Impression: Advanced DJD diffusely throughout the right foot  Assessment/Plan of Care: 1.  DJD/arthritis right foot 2.  History of chronic intermittent gout affecting the right foot  -Patient evaluated.  X-rays reviewed -Currently the patient's arthritic condition is mostly asymptomatic.  He is able to get by just fine without really affecting his quality of life. -Continue management of gout -Continue wearing good supportive shoes and sneakers -Return to clinic as needed       Felecia Shelling, DPM [facility]  Dr. Felecia Shelling, DPM    [address] [place], [place] [address]                Office [phone]  Fax [phone]    [employer] what is the best AI software that will create pictures based on description

## 2023-09-26 ENCOUNTER — Other Ambulatory Visit: Payer: BC Managed Care – PPO

## 2023-09-29 ENCOUNTER — Other Ambulatory Visit (INDEPENDENT_AMBULATORY_CARE_PROVIDER_SITE_OTHER): Payer: BC Managed Care – PPO

## 2023-09-29 DIAGNOSIS — E781 Pure hyperglyceridemia: Secondary | ICD-10-CM

## 2023-09-29 DIAGNOSIS — Z0000 Encounter for general adult medical examination without abnormal findings: Secondary | ICD-10-CM | POA: Diagnosis not present

## 2023-09-29 LAB — CBC WITH DIFFERENTIAL/PLATELET
Basophils Absolute: 0 10*3/uL (ref 0.0–0.1)
Basophils Relative: 0.6 % (ref 0.0–3.0)
Eosinophils Absolute: 0.1 10*3/uL (ref 0.0–0.7)
Eosinophils Relative: 1.9 % (ref 0.0–5.0)
HCT: 47.4 % (ref 39.0–52.0)
Hemoglobin: 16.4 g/dL (ref 13.0–17.0)
Lymphocytes Relative: 42.3 % (ref 12.0–46.0)
Lymphs Abs: 2.3 10*3/uL (ref 0.7–4.0)
MCHC: 34.6 g/dL (ref 30.0–36.0)
MCV: 85.5 fL (ref 78.0–100.0)
Monocytes Absolute: 0.4 10*3/uL (ref 0.1–1.0)
Monocytes Relative: 7 % (ref 3.0–12.0)
Neutro Abs: 2.6 10*3/uL (ref 1.4–7.7)
Neutrophils Relative %: 48.2 % (ref 43.0–77.0)
Platelets: 186 10*3/uL (ref 150.0–400.0)
RBC: 5.54 Mil/uL (ref 4.22–5.81)
RDW: 12.8 % (ref 11.5–15.5)
WBC: 5.5 10*3/uL (ref 4.0–10.5)

## 2023-10-03 LAB — ERYTHROPOIETIN: Erythropoietin: 20.1 m[IU]/mL — ABNORMAL HIGH (ref 2.6–18.5)

## 2023-10-04 ENCOUNTER — Encounter: Payer: Self-pay | Admitting: Family Medicine

## 2023-10-04 ENCOUNTER — Other Ambulatory Visit: Payer: Self-pay

## 2023-10-04 DIAGNOSIS — E781 Pure hyperglyceridemia: Secondary | ICD-10-CM

## 2023-10-18 ENCOUNTER — Other Ambulatory Visit (INDEPENDENT_AMBULATORY_CARE_PROVIDER_SITE_OTHER): Payer: BC Managed Care – PPO

## 2023-10-18 DIAGNOSIS — E781 Pure hyperglyceridemia: Secondary | ICD-10-CM

## 2023-10-19 DIAGNOSIS — F4323 Adjustment disorder with mixed anxiety and depressed mood: Secondary | ICD-10-CM | POA: Diagnosis not present

## 2023-10-19 LAB — ERYTHROPOIETIN: Erythropoietin: 15.5 m[IU]/mL (ref 2.6–18.5)

## 2023-10-20 DIAGNOSIS — G4733 Obstructive sleep apnea (adult) (pediatric): Secondary | ICD-10-CM | POA: Diagnosis not present

## 2023-10-21 ENCOUNTER — Other Ambulatory Visit: Payer: Self-pay

## 2023-10-21 DIAGNOSIS — E781 Pure hyperglyceridemia: Secondary | ICD-10-CM

## 2023-10-24 ENCOUNTER — Other Ambulatory Visit: Payer: Self-pay | Admitting: Family Medicine

## 2023-10-24 DIAGNOSIS — M1A9XX0 Chronic gout, unspecified, without tophus (tophi): Secondary | ICD-10-CM

## 2023-11-20 DIAGNOSIS — G4733 Obstructive sleep apnea (adult) (pediatric): Secondary | ICD-10-CM | POA: Diagnosis not present

## 2023-12-06 DIAGNOSIS — F4323 Adjustment disorder with mixed anxiety and depressed mood: Secondary | ICD-10-CM | POA: Diagnosis not present

## 2023-12-18 DIAGNOSIS — G4733 Obstructive sleep apnea (adult) (pediatric): Secondary | ICD-10-CM | POA: Diagnosis not present

## 2024-01-06 ENCOUNTER — Other Ambulatory Visit (INDEPENDENT_AMBULATORY_CARE_PROVIDER_SITE_OTHER): Payer: BC Managed Care – PPO

## 2024-01-06 ENCOUNTER — Encounter: Payer: Self-pay | Admitting: Family Medicine

## 2024-01-06 DIAGNOSIS — E781 Pure hyperglyceridemia: Secondary | ICD-10-CM

## 2024-01-06 LAB — CBC WITH DIFFERENTIAL/PLATELET
Basophils Absolute: 0 10*3/uL (ref 0.0–0.1)
Basophils Relative: 0.3 % (ref 0.0–3.0)
Eosinophils Absolute: 0.1 10*3/uL (ref 0.0–0.7)
Eosinophils Relative: 1.7 % (ref 0.0–5.0)
HCT: 42.8 % (ref 39.0–52.0)
Hemoglobin: 15.1 g/dL (ref 13.0–17.0)
Lymphocytes Relative: 40.9 % (ref 12.0–46.0)
Lymphs Abs: 2.2 10*3/uL (ref 0.7–4.0)
MCHC: 35.3 g/dL (ref 30.0–36.0)
MCV: 83.1 fl (ref 78.0–100.0)
Monocytes Absolute: 0.4 10*3/uL (ref 0.1–1.0)
Monocytes Relative: 8.1 % (ref 3.0–12.0)
Neutro Abs: 2.6 10*3/uL (ref 1.4–7.7)
Neutrophils Relative %: 49 % (ref 43.0–77.0)
Platelets: 166 10*3/uL (ref 150.0–400.0)
RBC: 5.14 Mil/uL (ref 4.22–5.81)
RDW: 12.7 % (ref 11.5–15.5)
WBC: 5.3 10*3/uL (ref 4.0–10.5)

## 2024-01-09 LAB — ERYTHROPOIETIN: Erythropoietin: 14.3 m[IU]/mL (ref 2.6–18.5)

## 2024-01-13 ENCOUNTER — Other Ambulatory Visit: Payer: Self-pay | Admitting: Family Medicine

## 2024-01-13 DIAGNOSIS — M1A9XX0 Chronic gout, unspecified, without tophus (tophi): Secondary | ICD-10-CM

## 2024-02-02 DIAGNOSIS — F4323 Adjustment disorder with mixed anxiety and depressed mood: Secondary | ICD-10-CM | POA: Diagnosis not present

## 2024-04-10 ENCOUNTER — Other Ambulatory Visit: Payer: Self-pay | Admitting: Family Medicine

## 2024-04-10 DIAGNOSIS — M1A9XX0 Chronic gout, unspecified, without tophus (tophi): Secondary | ICD-10-CM

## 2024-05-10 DIAGNOSIS — F4323 Adjustment disorder with mixed anxiety and depressed mood: Secondary | ICD-10-CM | POA: Diagnosis not present

## 2024-05-27 DIAGNOSIS — G4733 Obstructive sleep apnea (adult) (pediatric): Secondary | ICD-10-CM | POA: Diagnosis not present

## 2024-06-12 ENCOUNTER — Other Ambulatory Visit: Payer: Self-pay | Admitting: Family Medicine

## 2024-06-12 DIAGNOSIS — M1A9XX0 Chronic gout, unspecified, without tophus (tophi): Secondary | ICD-10-CM

## 2024-07-13 ENCOUNTER — Other Ambulatory Visit: Payer: Self-pay | Admitting: Family Medicine

## 2024-07-13 DIAGNOSIS — M1A9XX0 Chronic gout, unspecified, without tophus (tophi): Secondary | ICD-10-CM

## 2024-07-27 DIAGNOSIS — G4733 Obstructive sleep apnea (adult) (pediatric): Secondary | ICD-10-CM | POA: Diagnosis not present

## 2024-08-05 ENCOUNTER — Other Ambulatory Visit: Payer: Self-pay | Admitting: Family Medicine

## 2024-08-05 DIAGNOSIS — M1A9XX0 Chronic gout, unspecified, without tophus (tophi): Secondary | ICD-10-CM

## 2024-08-18 ENCOUNTER — Other Ambulatory Visit: Payer: Self-pay | Admitting: Family Medicine

## 2024-08-24 ENCOUNTER — Ambulatory Visit: Payer: BC Managed Care – PPO | Admitting: Family Medicine

## 2024-08-24 ENCOUNTER — Encounter: Payer: Self-pay | Admitting: Family Medicine

## 2024-08-24 ENCOUNTER — Ambulatory Visit: Payer: Self-pay | Admitting: Family Medicine

## 2024-08-24 VITALS — BP 128/80 | HR 95 | Temp 97.8°F | Resp 16 | Ht 74.0 in | Wt 249.6 lb

## 2024-08-24 DIAGNOSIS — Z1211 Encounter for screening for malignant neoplasm of colon: Secondary | ICD-10-CM

## 2024-08-24 DIAGNOSIS — M1A9XX0 Chronic gout, unspecified, without tophus (tophi): Secondary | ICD-10-CM

## 2024-08-24 DIAGNOSIS — Z0000 Encounter for general adult medical examination without abnormal findings: Secondary | ICD-10-CM

## 2024-08-24 DIAGNOSIS — B001 Herpesviral vesicular dermatitis: Secondary | ICD-10-CM | POA: Diagnosis not present

## 2024-08-24 LAB — COMPREHENSIVE METABOLIC PANEL WITH GFR
ALT: 22 U/L (ref 0–53)
AST: 22 U/L (ref 0–37)
Albumin: 4.7 g/dL (ref 3.5–5.2)
Alkaline Phosphatase: 62 U/L (ref 39–117)
BUN: 24 mg/dL — ABNORMAL HIGH (ref 6–23)
CO2: 28 meq/L (ref 19–32)
Calcium: 9.3 mg/dL (ref 8.4–10.5)
Chloride: 100 meq/L (ref 96–112)
Creatinine, Ser: 1.24 mg/dL (ref 0.40–1.50)
GFR: 63.31 mL/min (ref >60.00)
Glucose, Bld: 81 mg/dL (ref 70–99)
Potassium: 4.4 meq/L (ref 3.5–5.1)
Sodium: 135 meq/L (ref 135–145)
Total Bilirubin: 0.8 mg/dL (ref 0.2–1.2)
Total Protein: 7.2 g/dL (ref 6.0–8.3)

## 2024-08-24 LAB — CBC
HCT: 43.6 % (ref 39.0–52.0)
Hemoglobin: 15.4 g/dL (ref 13.0–17.0)
MCHC: 35.3 g/dL (ref 30.0–36.0)
MCV: 84.1 fl (ref 78.0–100.0)
Platelets: 157 K/uL (ref 150.0–400.0)
RBC: 5.18 Mil/uL (ref 4.22–5.81)
RDW: 13.1 % (ref 11.5–15.5)
WBC: 4.9 K/uL (ref 4.0–10.5)

## 2024-08-24 LAB — LIPID PANEL
Cholesterol: 162 mg/dL (ref 0–200)
HDL: 41 mg/dL (ref >39.00)
LDL Cholesterol: 91 mg/dL (ref 0–99)
NonHDL: 121.37
Total CHOL/HDL Ratio: 4
Triglycerides: 152 mg/dL — ABNORMAL HIGH (ref 0.0–149.0)
VLDL: 30.4 mg/dL (ref 0.0–40.0)

## 2024-08-24 LAB — URIC ACID: Uric Acid, Serum: 5.7 mg/dL (ref 4.0–7.8)

## 2024-08-24 NOTE — Patient Instructions (Signed)
 Give us  2-3 business days to get the results of your labs back.   Keep the diet clean and stay active.  Please get me a copy of your advanced directive form at your convenience.   The Shingrix vaccine (for shingles) is a 2 shot series spaced 2-6 months apart. It can make people feel low energy, achy and almost like they have the flu for 48 hours after injection. 1/5 people can have nausea and/or vomiting. Please plan accordingly when deciding on when to get this shot. Call our office for a nurse visit appointment to get this. The second shot of the series is less severe regarding the side effects, but it still lasts 48 hours.   Please consider getting the pneumonia vaccination.   Let us  know if you need anything.

## 2024-08-24 NOTE — Progress Notes (Signed)
 Chief Complaint  Patient presents with   Annual Exam    CPE    Well Male Corey Nguyen is here for a complete physical.   His last physical was >1 year ago.  Current diet: in general, a healthy diet.   Current exercise: cardio, strength training Weight trend: stable Fatigue out of ordinary? No. Seat belt? Yes.   Advanced directive? Yes  Health maintenance Shingrix- No Colonoscopy- Due Tetanus- No Hep C- Yes Pneumonia vaccine- No  Gout: Patient has a history of gout taking allopurinol  200 mg daily.  Reports compliance, no adverse effects.  Has not had a serious flare in quite some time.  Avoids high purine foods.  He has really cut down on his alcohol intake.  Recurrent cold sores: Taking Valtrex  500 mg daily.  Compliant, no adverse effects.  He does not remember when his last outbreak was.  Content on current regimen.  Past Medical History:  Diagnosis Date   Chicken pox    childhood   Hyperlipidemia    borderline   Hypertension    Hypertriglyceridemia 07/13/2017   Idiopathic gout of knee    Measles    childhood   Mumps    childhood   Sleep apnea    CPAP     Past Surgical History:  Procedure Laterality Date   TESTICLE REMOVAL     Age 34-Undescended    Medications  Current Outpatient Medications on File Prior to Visit  Medication Sig Dispense Refill   allopurinol  (ZYLOPRIM ) 100 MG tablet TAKE 2 TABLETS(200MG  TOTAL)DAILY 180 tablet 2   celecoxib  (CELEBREX ) 100 MG capsule TAKE 1 CAPSULES TWICE DAILY AS NEEDED FOR PAIN. 180 capsule 1   Multiple Vitamin (MULTI VITAMIN MENS PO) Take 1 tablet by mouth daily.     Omega-3 Fatty Acids (FISH OIL) 1000 MG CAPS Take 1 capsule by mouth daily.     valACYclovir  (VALTREX ) 500 MG tablet TAKE 1 TABLET DAILY 90 tablet 2   Wheat Dextrin (BENEFIBER DRINK MIX PO) Take by mouth. Take as directed.      Allergies No Known Allergies  Family History Family History  Problem Relation Age of Onset   Hypercholesterolemia Father         Living   Hypercholesterolemia Brother    Healthy Mother        Living   Diabetes Maternal Grandfather        Insulin   Heart defect Maternal Grandmother    Alzheimer's disease Maternal Grandmother    Prostate cancer Paternal Grandfather    Healthy Paternal Grandmother        Living   Healthy Paternal Uncle    Diabetes Maternal Aunt    Healthy Maternal Aunt    Migraines Daughter    Healthy Other        Siblings    Review of Systems: Constitutional:  no fevers Eye:  no recent significant change in vision Ears:  No changes in hearing Nose/Mouth/Throat:  no complaints of nasal congestion, no sore throat Cardiovascular: no chest pain Respiratory:  No shortness of breath Gastrointestinal:  No change in bowel habits GU:  No frequency Integumentary:  no abnormal skin lesions reported Neurologic:  no headaches Endocrine:  denies unexplained weight changes  Exam BP 128/80 (BP Location: Left Arm, Patient Position: Sitting)   Pulse 95   Temp 97.8 F (36.6 C) (Oral)   Resp 16   Ht 6' 2 (1.88 m)   Wt 249 lb 9.6 oz (113.2 kg)   SpO2  98%   BMI 32.05 kg/m  General:  well developed, well nourished, in no apparent distress Skin:  no significant moles, warts, or growths Head:  no masses, lesions, or tenderness Eyes:  pupils equal and round, sclera anicteric without injection Ears:  canals without lesions, TMs shiny without retraction, no obvious effusion, no erythema Nose:  nares patent, mucosa normal Throat/Pharynx:  lips and gingiva without lesion; tongue and uvula midline; non-inflamed pharynx; no exudates or postnasal drainage Lungs:  clear to auscultation, breath sounds equal bilaterally, no respiratory distress Cardio:  regular rate and rhythm, no LE edema or bruits Rectal: Deferred GI: BS+, S, NT, ND, no masses or organomegaly Musculoskeletal:  symmetrical muscle groups noted without atrophy or deformity Neuro:  gait normal; deep tendon reflexes normal and  symmetric Psych: well oriented with normal range of affect and appropriate judgment/insight  Assessment and Plan  Well adult exam - Plan: CBC, Comprehensive metabolic panel with GFR, Lipid panel, Uric acid  Colon cancer screening - Plan: Ambulatory referral to Gastroenterology  Chronic gout involving toe of right foot without tophus, unspecified cause  Recurrent cold sores   Well 58 y.o. male. Counseled on diet and exercise. Advanced directive form provided today.  Counseled on risks/benefits on of PSA screening for prostate cancer. He agrees to forego screening.  CCS: refer back to GI as he is due for his 7 yr f/u.  Recurrent cold sores: Chronic, stable. Cont Valtrex  500 mg/d.  Gout: Chronic, stable. Cont allopurinol  200 mg/d.  Flu shot politely declined. Will get Shingrix and PCV20 in the future.  Other orders as above. Follow up in 1 yr.  The patient voiced understanding and agreement to the plan.  Mabel Mt DeRidder, DO 08/24/24 7:44 AM

## 2024-09-04 DIAGNOSIS — D485 Neoplasm of uncertain behavior of skin: Secondary | ICD-10-CM | POA: Diagnosis not present

## 2024-09-04 DIAGNOSIS — L570 Actinic keratosis: Secondary | ICD-10-CM | POA: Diagnosis not present

## 2024-09-04 DIAGNOSIS — C44519 Basal cell carcinoma of skin of other part of trunk: Secondary | ICD-10-CM | POA: Diagnosis not present

## 2024-09-04 DIAGNOSIS — D1801 Hemangioma of skin and subcutaneous tissue: Secondary | ICD-10-CM | POA: Diagnosis not present

## 2024-09-04 DIAGNOSIS — L817 Pigmented purpuric dermatosis: Secondary | ICD-10-CM | POA: Diagnosis not present

## 2024-09-04 DIAGNOSIS — H61192 Noninfective disorders of pinna, left ear: Secondary | ICD-10-CM | POA: Diagnosis not present

## 2024-09-11 DIAGNOSIS — F4323 Adjustment disorder with mixed anxiety and depressed mood: Secondary | ICD-10-CM | POA: Diagnosis not present

## 2024-09-21 ENCOUNTER — Ambulatory Visit

## 2024-09-21 DIAGNOSIS — Z23 Encounter for immunization: Secondary | ICD-10-CM | POA: Diagnosis not present

## 2024-09-21 NOTE — Progress Notes (Signed)
 Patient here today for first Shingrix  vaccine per Dr. Frann.  Vaccine given in left deltoid and patient tolerated well.

## 2024-11-30 ENCOUNTER — Ambulatory Visit

## 2025-09-06 ENCOUNTER — Encounter: Admitting: Family Medicine
